# Patient Record
Sex: Female | Born: 1985 | Race: White | Hispanic: No | Marital: Single | Smoking: Former smoker
Health system: Southern US, Community
[De-identification: ages and names within clinical notes are randomized; demographics above are authoritative.]

## PROBLEM LIST (undated history)

## (undated) DIAGNOSIS — D689 Coagulation defect, unspecified: Secondary | ICD-10-CM

## (undated) DIAGNOSIS — R110 Nausea: Secondary | ICD-10-CM

## (undated) DIAGNOSIS — L940 Localized scleroderma [morphea]: Secondary | ICD-10-CM

## (undated) DIAGNOSIS — R011 Cardiac murmur, unspecified: Secondary | ICD-10-CM

## (undated) DIAGNOSIS — G47419 Narcolepsy without cataplexy: Secondary | ICD-10-CM

## (undated) DIAGNOSIS — R251 Tremor, unspecified: Secondary | ICD-10-CM

## (undated) DIAGNOSIS — R5382 Chronic fatigue, unspecified: Secondary | ICD-10-CM

## (undated) DIAGNOSIS — K529 Noninfective gastroenteritis and colitis, unspecified: Secondary | ICD-10-CM

## (undated) DIAGNOSIS — J45909 Unspecified asthma, uncomplicated: Secondary | ICD-10-CM

## (undated) DIAGNOSIS — G4730 Sleep apnea, unspecified: Secondary | ICD-10-CM

## (undated) DIAGNOSIS — B009 Herpesviral infection, unspecified: Secondary | ICD-10-CM

## (undated) DIAGNOSIS — J309 Allergic rhinitis, unspecified: Secondary | ICD-10-CM

## (undated) DIAGNOSIS — D649 Anemia, unspecified: Secondary | ICD-10-CM

## (undated) DIAGNOSIS — G4733 Obstructive sleep apnea (adult) (pediatric): Secondary | ICD-10-CM

## (undated) DIAGNOSIS — F32A Depression, unspecified: Secondary | ICD-10-CM

## (undated) DIAGNOSIS — E039 Hypothyroidism, unspecified: Secondary | ICD-10-CM

## (undated) DIAGNOSIS — M1990 Unspecified osteoarthritis, unspecified site: Secondary | ICD-10-CM

## (undated) DIAGNOSIS — T7840XA Allergy, unspecified, initial encounter: Secondary | ICD-10-CM

## (undated) DIAGNOSIS — N3090 Cystitis, unspecified without hematuria: Secondary | ICD-10-CM

## (undated) DIAGNOSIS — G43909 Migraine, unspecified, not intractable, without status migrainosus: Secondary | ICD-10-CM

## (undated) DIAGNOSIS — J4530 Mild persistent asthma, uncomplicated: Secondary | ICD-10-CM

## (undated) DIAGNOSIS — I7300 Raynaud's syndrome without gangrene: Secondary | ICD-10-CM

## (undated) DIAGNOSIS — K589 Irritable bowel syndrome without diarrhea: Secondary | ICD-10-CM

## (undated) DIAGNOSIS — F329 Major depressive disorder, single episode, unspecified: Secondary | ICD-10-CM

## (undated) DIAGNOSIS — K219 Gastro-esophageal reflux disease without esophagitis: Secondary | ICD-10-CM

## (undated) DIAGNOSIS — M797 Fibromyalgia: Secondary | ICD-10-CM

## (undated) DIAGNOSIS — F419 Anxiety disorder, unspecified: Secondary | ICD-10-CM

## (undated) DIAGNOSIS — I10 Essential (primary) hypertension: Secondary | ICD-10-CM

## (undated) DIAGNOSIS — R42 Dizziness and giddiness: Secondary | ICD-10-CM

## (undated) DIAGNOSIS — J029 Acute pharyngitis, unspecified: Secondary | ICD-10-CM

## (undated) DIAGNOSIS — Z8619 Personal history of other infectious and parasitic diseases: Secondary | ICD-10-CM

## (undated) DIAGNOSIS — M65319 Trigger thumb, unspecified thumb: Secondary | ICD-10-CM

## (undated) DIAGNOSIS — R000 Tachycardia, unspecified: Secondary | ICD-10-CM

## (undated) DIAGNOSIS — R0600 Dyspnea, unspecified: Secondary | ICD-10-CM

## (undated) DIAGNOSIS — A770 Spotted fever due to Rickettsia rickettsii: Secondary | ICD-10-CM

## (undated) DIAGNOSIS — B977 Papillomavirus as the cause of diseases classified elsewhere: Secondary | ICD-10-CM

## (undated) DIAGNOSIS — J449 Chronic obstructive pulmonary disease, unspecified: Secondary | ICD-10-CM

## (undated) DIAGNOSIS — R609 Edema, unspecified: Secondary | ICD-10-CM

## (undated) DIAGNOSIS — R002 Palpitations: Secondary | ICD-10-CM

## (undated) DIAGNOSIS — E0590 Thyrotoxicosis, unspecified without thyrotoxic crisis or storm: Secondary | ICD-10-CM

## (undated) DIAGNOSIS — A681 Tick-borne relapsing fever: Secondary | ICD-10-CM

## (undated) DIAGNOSIS — Z720 Tobacco use: Secondary | ICD-10-CM

## (undated) HISTORY — DX: Chronic obstructive pulmonary disease, unspecified: J44.9

## (undated) HISTORY — DX: Tachycardia, unspecified: R00.0

## (undated) HISTORY — DX: Cystitis, unspecified without hematuria: N30.90

## (undated) HISTORY — DX: Palpitations: R00.2

## (undated) HISTORY — DX: Dyspnea, unspecified: R06.00

## (undated) HISTORY — DX: Anemia, unspecified: D64.9

## (undated) HISTORY — DX: Cardiac murmur, unspecified: R01.1

## (undated) HISTORY — DX: Chronic fatigue, unspecified: R53.82

## (undated) HISTORY — DX: Unspecified osteoarthritis, unspecified site: M19.90

## (undated) HISTORY — DX: Trigger thumb, unspecified thumb: M65.319

## (undated) HISTORY — DX: Tick-borne relapsing fever: A68.1

## (undated) HISTORY — DX: Acute pharyngitis, unspecified: J02.9

## (undated) HISTORY — DX: Mild persistent asthma, uncomplicated: J45.30

## (undated) HISTORY — DX: Migraine, unspecified, not intractable, without status migrainosus: G43.909

## (undated) HISTORY — DX: Sleep apnea, unspecified: G47.30

## (undated) HISTORY — DX: Coagulation defect, unspecified: D68.9

## (undated) HISTORY — DX: Tremor, unspecified: R25.1

## (undated) HISTORY — DX: Irritable bowel syndrome, unspecified: K58.9

## (undated) HISTORY — DX: Gastro-esophageal reflux disease without esophagitis: K21.9

## (undated) HISTORY — DX: Raynaud's syndrome without gangrene: I73.00

## (undated) HISTORY — DX: Edema, unspecified: R60.9

## (undated) HISTORY — DX: Fibromyalgia: M79.7

## (undated) HISTORY — DX: Nausea: R11.0

## (undated) HISTORY — DX: Personal history of other infectious and parasitic diseases: Z86.19

## (undated) HISTORY — PX: APPENDECTOMY: SHX54

## (undated) HISTORY — DX: Allergy, unspecified, initial encounter: T78.40XA

## (undated) HISTORY — DX: Noninfective gastroenteritis and colitis, unspecified: K52.9

## (undated) HISTORY — DX: Allergic rhinitis, unspecified: J30.9

---
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
## 2009-01-21 ENCOUNTER — Inpatient Hospital Stay (HOSPITAL_COMMUNITY): Admission: AD | Admit: 2009-01-21 | Discharge: 2009-01-21 | Payer: Self-pay | Admitting: Obstetrics & Gynecology

## 2009-02-25 ENCOUNTER — Ambulatory Visit: Admission: RE | Admit: 2009-02-25 | Discharge: 2009-02-25 | Payer: Self-pay | Admitting: Obstetrics and Gynecology

## 2009-02-26 ENCOUNTER — Ambulatory Visit (HOSPITAL_COMMUNITY): Admission: AD | Admit: 2009-02-26 | Discharge: 2009-02-26 | Payer: Self-pay | Admitting: Obstetrics & Gynecology

## 2009-04-16 ENCOUNTER — Inpatient Hospital Stay (HOSPITAL_COMMUNITY): Admission: AD | Admit: 2009-04-16 | Discharge: 2009-04-16 | Payer: Self-pay | Admitting: Obstetrics and Gynecology

## 2009-04-19 ENCOUNTER — Inpatient Hospital Stay (HOSPITAL_COMMUNITY): Admission: AD | Admit: 2009-04-19 | Discharge: 2009-04-21 | Payer: Self-pay | Admitting: Obstetrics and Gynecology

## 2009-04-29 ENCOUNTER — Inpatient Hospital Stay (HOSPITAL_COMMUNITY): Admission: AD | Admit: 2009-04-29 | Discharge: 2009-04-29 | Payer: Self-pay | Admitting: Obstetrics and Gynecology

## 2009-11-01 ENCOUNTER — Inpatient Hospital Stay (HOSPITAL_COMMUNITY): Admission: AD | Admit: 2009-11-01 | Discharge: 2009-11-01 | Payer: Self-pay | Admitting: Obstetrics and Gynecology

## 2010-10-05 LAB — POCT PREGNANCY, URINE: Preg Test, Ur: NEGATIVE

## 2010-10-05 LAB — CBC
HCT: 42.4 % (ref 36.0–46.0)
Hemoglobin: 14.3 g/dL (ref 12.0–15.0)
RBC: 4.43 MIL/uL (ref 3.87–5.11)
RDW: 14.7 % (ref 11.5–15.5)
WBC: 10.5 10*3/uL (ref 4.0–10.5)

## 2010-10-21 LAB — CBC
HCT: 34.1 % — ABNORMAL LOW (ref 36.0–46.0)
Hemoglobin: 11.6 g/dL — ABNORMAL LOW (ref 12.0–15.0)
MCHC: 34.2 g/dL (ref 30.0–36.0)
MCHC: 34.8 g/dL (ref 30.0–36.0)
MCV: 96.8 fL (ref 78.0–100.0)
MCV: 97.8 fL (ref 78.0–100.0)
Platelets: 263 10*3/uL (ref 150–400)
RDW: 12.7 % (ref 11.5–15.5)
RDW: 13.1 % (ref 11.5–15.5)

## 2010-10-24 LAB — URINALYSIS, ROUTINE W REFLEX MICROSCOPIC
Bilirubin Urine: NEGATIVE
Ketones, ur: NEGATIVE mg/dL
Nitrite: NEGATIVE
Protein, ur: NEGATIVE mg/dL

## 2010-10-24 LAB — DIFFERENTIAL
Basophils Relative: 0 % (ref 0–1)
Lymphs Abs: 2.8 10*3/uL (ref 0.7–4.0)
Monocytes Absolute: 1 10*3/uL (ref 0.1–1.0)
Monocytes Relative: 6 % (ref 3–12)
Neutro Abs: 13.2 10*3/uL — ABNORMAL HIGH (ref 1.7–7.7)
Neutrophils Relative %: 77 % (ref 43–77)

## 2010-10-24 LAB — COMPREHENSIVE METABOLIC PANEL
Albumin: 3 g/dL — ABNORMAL LOW (ref 3.5–5.2)
Alkaline Phosphatase: 71 U/L (ref 39–117)
BUN: 5 mg/dL — ABNORMAL LOW (ref 6–23)
Calcium: 8.7 mg/dL (ref 8.4–10.5)
Potassium: 3.5 mEq/L (ref 3.5–5.1)
Sodium: 133 mEq/L — ABNORMAL LOW (ref 135–145)
Total Protein: 6.2 g/dL (ref 6.0–8.3)

## 2010-10-24 LAB — CBC
HCT: 29.5 % — ABNORMAL LOW (ref 36.0–46.0)
MCHC: 35.7 g/dL (ref 30.0–36.0)
Platelets: 288 10*3/uL (ref 150–400)
RDW: 11.9 % (ref 11.5–15.5)

## 2010-12-03 NOTE — Discharge Summary (Signed)
NAME:  Amber [name], ANDON NO.:  [id number]   MEDICAL RECORD NO.:  [id number]          PATIENT TYPE:  IPS   LOCATION:  0502                          FACILITY:  [facility]   PHYSICIAN:  Jeanice Lim, M.D. DATE OF BIRTH:  [birth year]   DATE OF ADMISSION:  08/01/2005  DATE OF DISCHARGE:  08/19/2005                                 DISCHARGE SUMMARY   IDENTIFYING DATA:  This is an 25 year old single Caucasian female  voluntarily admitted.  Presenting with a history of depression and suicidal  ideation following after a break up with boyfriend.  The patient has quite a  bit of difficulty after relationships end with coping with being on her own,  had been crying, breaking down, calling in sick from work, had been binge-  drinking on the weekends.  Used cocaine intermittently in the past and had  lost 13 pounds.  The patient described significant mood swings with racing  thoughts and reactivity, becoming initially anxious and then anger and rage  at times, anger first and then severe panic.   PAST PSYCHIATRIC HISTORY:  [facility] admission.  Had a  history of suicidal ideation with a plan and cutting in the past at age 56.  Currently living with her mother who patient reports is alcohol dependent.  Previously with grandmother.  Asked to move out because she could not follow  the rules.  Works at [employer] and, again, has been calling in sick.  Drinking  escalating amounts on the weekend.  Apparently was not drinking or using  substances when with the boyfriend.   MEDICAL HISTORY:  No medical problems.   MEDICATIONS:  Birth control pill, acid reflux medicine.  Had been on Ambien  and Effexor.   ALLERGIES:  No known drug allergies.   PHYSICAL EXAMINATION:  Physical and neurologic exam essentially within  normal limits.   MENTAL STATUS EXAM:  Fully alert female.  Cooperative.  Good eye contact.  Speech clear.  Mood depressed, teary-eyed.  Thought processes goal  directed.  Cognitively intact.  Judgment and insight were fair.   ADMISSION DIAGNOSES:  AXIS I:  Major depressive disorder, recurrent, severe.  Rule out bipolar disorder, type 2.  Possible alcohol abuse with binge  pattern.  AXIS II:  Deferred.  AXIS III:  Urinary tract infection.  AXIS IV:  Moderate (stressors with unhealthy living situation and limited  support system and lack of mature coping skills to deal with stress and  changes in mood).  AXIS V:  35/60.   HOSPITAL COURSE:  The patient was admitted and ordered routine p.r.n.  medications and underwent further monitoring.  Was encouraged to participate  in individual, group and milieu therapy.  Family session was ordered to get  more collateral history since patient's report was somewhat inconsistent at  times and to identify aftercare planning and identify the safety of her  support system.  The patient described initially clear mood swings, anger,  not sleeping, needing more and more medications, taking Librium and Seroquel  and not sleeping, although she began to be clearly observed to be sleeping  more and more during the day and then, when seen, would be up and  complaining of feeling anxious, angry, irritable and then go back to sleep  after being seen.  Medications have been titrated up to try to stabilize  patient's complaints and then, when patient is possibly med-seeking or  calming down her moods with taking excessive medications is more clear and  family was spoken to regarding this and they had confirmed that she does  apparently do this at home as well.  The patient was tapered down to a  minimal effective doses of medications to improve cognition.  The patient,  at one point, was experiencing some visual and auditory hallucinations and  confusion, which again appeared to be interactions to medications and a  behavioral idea episode which results in her getting even more medication.  When medications were  decreased, the patient gradually cleared.  Still  complained of feeling agitated at times with mood instability but overall  appeared to be able to control mood and as she cognitively became more clear  she was more able to identify triggers for her mood instability and  healthier coping skills.  The patient's mood was much less depressed, much  less desperate with significant decrease in anxiety.  Was able to be taken  off one-to-one when her mental state cleared and patient was advised to work  on healthier behavioral changes, like being out of bed, out of her room but,  during the day, exercising, only sleeping at night, not taking more  medication to help her sleep at night and not taking stimulants to help her  stay awake during the day by caffeine and to stay in a structured routine  and develop a healthier support system.  Also, she identified her living  situation as being unhealthy and she was also advised strongly that she  needs therapy and therapy will likely be more beneficial including CBT and  DBT in the long run than relying on medications.   CONDITION ON DISCHARGE:  The patient was discharged in significantly  improved condition.  Family was pleased with the patient's progress.  There  was an occasional phone call to mother in the evenings and she would be  upset about the patient's worsening state and this was addressed as soon as  message was relayed to psychiatrist.  Psychiatrist contacted mother and  explained medication changes.  Mother was then pleased with patient's  progress as well as mother agreed that she may need to get treatment  regarding her mood and actually seen somebody recently and was also advised  to get sleep since she had not slept in three nights and mother seemed  thankful and agreement of the treatment plan regarding the daughter.  Issues regarding patient's escalating around dose or adjusting her own doses of  medications were discussed and  medications were being monitored.  The  patient was given medication education at the time of discharge and, at that  time, her mood was stable.  Alert and oriented x4.  Clear.  No psychotic  symptoms.  No mood instability.  No anxiety.  No psychotic symptoms.  No  dangerous ideation.   DISCHARGE MEDICATIONS:  1.  Depakote ER 250 mg in the morning and 250 mg, 2 at 9 p.m., totaling 500      mg at 9 p.m.  2.  Vistaril 50 mg every eight hours only as needed for anxiety.  3.  __________ 3 in a 24-hour period.  4.  Trazodone 100 mg at 9 p.m.  5.  Risperdal 1 mg at 9 p.m.  6.  Provigil 100 mg at 7 a.m.  7.  Zoloft 25 mg q.a.m.   FOLLOW UP:  Behavioral changes again were emphasized and the importance of  patient being compliant with her follow-up therapy has been set up with  [facility] and [facility] follow-up for August 24, 2005 at 2:30 p.m. and therapist on August 24, 2005 at 12:45 p.m.  Parent  was to make sure that patient made it to her therapy and medication  management session.  The patient was discharged in significantly improved  condition.      Jeanice Lim, M.D.  Electronically Signed     JEM/MEDQ  D:  09/10/2005  T:  09/11/2005  Job:  [id number]

## 2010-12-03 NOTE — H&P (Signed)
NAME:  Amber [name], WARDA NO.:  [id number]   MEDICAL RECORD NO.:  [id number]          PATIENT TYPE:  IPS   LOCATION:  0502                          [facility]ILITY[facility]   PHYSICIAN:  Jeanice Lim, M.D. DATE OF BIRTH:  July 11, 1986   DATE OF ADMISSION:  08/01/2005  DATE OF DISCHARGE:                         PSYCHIATRIC ADMISSION ASSESSMENT   An 25 year old single white female voluntarily admitted August 01, 2005.   HISTORY OF PRESENT ILLNESS:  Patient presents with a history of depression  and suicidal thoughts with plans to crash her car.  Patient states her  symptoms have been present for about a month after a breakup with a  boyfriend.  She has been crying and breaking down at work.  She reports  difficulty sleeping, feeling like her mind is racing.  She states she has  been calling out to work due to her problems with anxiety.  She reports  recent use of binge drinking and has been using cocaine intermittently.  She  has lot about 15 pounds.  She denies any psychotic symptoms.   PAST PSYCHIATRIC HISTORY:  First admission to [facility].  She  has a history of overdosing and cutting herself at the age of 17.   SOCIAL HISTORY:  This is an 25 year old single white female with no children  who lives with her mother.  Finished high school, works at [employer] and has no  Print[employer].   FAMILY HISTORY:  Mother with alcohol problems.   ALCOHOL AND DRUG HISTORY:  Patient smokes.  She has been bingeing on the  weekend, drinking liquor.   PRIMARY CARE Helga Asbury:  Dr. Bethena Midget in [place].   MEDICAL PROBLEMS:  This is a healthy young female in no acute distress.   MEDICATIONS:  1.  Has been on birth control pills  2.  Ambien 10.  3.  Effexor.  4.  Takes medications for her acid reflux as well.   DRUG ALLERGIES:  No known drug allergies.   PHYSICAL EXAMINATION:  Patient was assessed in the emergency room.  This is  a young, healthy-appearing female.   Temperature 96.8, heart rate 80,  respirations 22, blood pressure 107/68.  Pregnancy test is negative.  Urine  drug screen positive for amphetamines.  Urinalysis shows 10-15 WBCs.  Glucose 55.  CBC within normal limits.   MENTAL STATUS EXAM:  Healthy alert female, cooperative with good eye  contact.  Speech is clear, normal rate and tone.  Patient feels depressed.  Patient gets teary eyed at times throughout the interview.  Thought  processes are coherent and linear.  Cognitive function intact.  Memory is  good.  Judgment and insight are fair.  Poor impulse control.   ADMISSION DIAGNOSIS:  AXIS I:  Major depressive disorder.  Alcohol abuse.  Cocaine abuse.  Rule out bipolar II disorder.  AXIS II:  Deferred.  AXIS III:  Urinary tract infection.  AXIS IV:  Other psychosocial problems.  AXIS V:  Current in 35.   PLAN:  Stabilize mood and thinking.  We will continue with patient  medications at this time.  She will have  some Seroquel for sleep.  Patient  is to increase her coping skills.  Underage drinking and substance abuse  were discussed.  We will have a family session with patient's support group.  Patient may need some individual therapy.  Tentative length of stay 4-6  days.      Landry Corporal, N.P.      Jeanice Lim, M.D.  Electronically Signed    JO/MEDQ  D:  08/05/2005  T:  08/05/2005  Job:  [id number]

## 2011-02-09 ENCOUNTER — Encounter (HOSPITAL_COMMUNITY): Payer: Self-pay | Admitting: *Deleted

## 2011-02-09 ENCOUNTER — Inpatient Hospital Stay (HOSPITAL_COMMUNITY)
Admission: AD | Admit: 2011-02-09 | Discharge: 2011-02-09 | Disposition: A | Discharge status: Home / Self Care | Payer: Medicaid Other | Source: Ambulatory Visit | Attending: Obstetrics & Gynecology | Admitting: Obstetrics & Gynecology

## 2011-02-09 DIAGNOSIS — Z3202 Encounter for pregnancy test, result negative: Secondary | ICD-10-CM | POA: Insufficient documentation

## 2011-02-09 DIAGNOSIS — F17200 Nicotine dependence, unspecified, uncomplicated: Secondary | ICD-10-CM | POA: Insufficient documentation

## 2011-02-09 HISTORY — DX: Major depressive disorder, single episode, unspecified: F32.9

## 2011-02-09 HISTORY — DX: Depression, unspecified: F32.A

## 2011-02-09 HISTORY — DX: Thyrotoxicosis, unspecified without thyrotoxic crisis or storm: E05.90

## 2011-02-09 HISTORY — DX: Anxiety disorder, unspecified: F41.9

## 2011-02-09 LAB — HCG, SERUM, QUALITATIVE: Preg, Serum: NEGATIVE

## 2011-02-09 NOTE — Progress Notes (Signed)
Pt states she is having oral surgery on 7-30 and wants to have a pregnancy test. Wants to have a blood pregnancy test. Her last period was lighter than usual. Has had 2 NEG HPT's. Is currently taking BCP's.

## 2011-02-09 NOTE — ED Provider Notes (Addendum)
History    patient is a 25 year old white female. She is a gravida 1 para 1. She states she is here for pregnancy test. She is scheduled for a oral surgery. She states these make sure she is not pregnant before that time. She states she thinks she is pregnant because she's been having pregnancy symptoms. She has denied any vaginal bleeding, vaginal discharge or severe abdominal pain.  Chief Complaint  Patient presents with  . Possible Pregnancy   HPI  OB History    Grav Para Term Preterm Abortions TAB SAB Ect Mult Living   1 1        1       Past Medical History  Diagnosis Date  . Hyperthyroidism   . Depression   . Anxiety     Past Surgical History  Procedure Date  . No past surgeries     No family history on file.  History  Substance Use Topics  . Smoking status: Current Everyday Smoker -- 1.0 packs/day  . Smokeless tobacco: Not on file  . Alcohol Use: No    Allergies: Allergies not on file  No prescriptions prior to admission    Review of Systems  Constitutional: Negative for fever and chills.  Eyes: Negative for blurred vision.  Cardiovascular: Negative for chest pain and palpitations.  Gastrointestinal: Negative for nausea, vomiting, abdominal pain, diarrhea and constipation.  Genitourinary: Negative for dysuria, urgency, frequency, hematuria and flank pain.  Neurological: Negative for dizziness and headaches.  Psychiatric/Behavioral: Negative for depression and suicidal ideas.   Physical Exam   Blood pressure 128/84, pulse 80, temperature 98.8 F (37.1 C), temperature source Oral, resp. rate 16, height 5\' 1"  (1.549 m), weight 112 lb 12.8 oz (51.166 kg), SpO2 98.00%.  Physical Exam  Constitutional: She is oriented to person, place, and time. She appears well-developed and well-nourished. No distress.  HENT:  Head: Normocephalic and atraumatic.  Eyes: EOM are normal. Pupils are equal, round, and reactive to light.  GI: Soft. She exhibits no distension and  no mass. There is no tenderness. There is no rebound and no guarding.  Neurological: She is alert and oriented to person, place, and time.  Skin: Skin is warm and dry. She is not diaphoretic.  Psychiatric: She has a normal mood and affect. Her behavior is normal. Judgment and thought content normal.    MAU Course  Procedures  Results for orders placed during the hospital encounter of 02/09/11 (from the past 24 hour(s))  POCT PREGNANCY, URINE     Status: Normal   Collection Time   02/09/11  5:25 PM      Component Value Range   Preg Test, Ur NEGATIVE    HCG, SERUM, QUALITATIVE     Status: Normal   Collection Time   02/09/11  6:57 PM      Component Value Range   Preg, Serum NEGATIVE  NEGATIVE      Assessment and Plan  Normal examination: Patient will followup with her primary care provider. She has any further problems she will contact us or present immediately. She understood this agreed.  Clinton Gallant. Rice III, DrHSc, MPAS, PA-C  02/09/2011, 6:39 PM

## 2011-02-09 NOTE — ED Notes (Signed)
Pt. To lobby to await lab results.

## 2011-02-09 NOTE — ED Notes (Signed)
Jenean Lindau, PA discussed current status and plan of care.

## 2011-07-19 HISTORY — PX: DILATION AND CURETTAGE OF UTERUS: SHX78

## 2012-01-17 ENCOUNTER — Encounter (HOSPITAL_COMMUNITY): Payer: Self-pay | Admitting: *Deleted

## 2012-01-17 ENCOUNTER — Emergency Department (HOSPITAL_COMMUNITY): Payer: Medicaid Other

## 2012-01-17 ENCOUNTER — Emergency Department (HOSPITAL_COMMUNITY)
Admission: EM | Admit: 2012-01-17 | Discharge: 2012-01-17 | Disposition: A | Discharge status: Home / Self Care | Payer: Medicaid Other | Attending: Emergency Medicine | Admitting: Emergency Medicine

## 2012-01-17 DIAGNOSIS — Z3490 Encounter for supervision of normal pregnancy, unspecified, unspecified trimester: Secondary | ICD-10-CM

## 2012-01-17 DIAGNOSIS — M549 Dorsalgia, unspecified: Secondary | ICD-10-CM | POA: Insufficient documentation

## 2012-01-17 DIAGNOSIS — N898 Other specified noninflammatory disorders of vagina: Secondary | ICD-10-CM | POA: Insufficient documentation

## 2012-01-17 DIAGNOSIS — F17200 Nicotine dependence, unspecified, uncomplicated: Secondary | ICD-10-CM | POA: Insufficient documentation

## 2012-01-17 DIAGNOSIS — F411 Generalized anxiety disorder: Secondary | ICD-10-CM | POA: Insufficient documentation

## 2012-01-17 DIAGNOSIS — O99891 Other specified diseases and conditions complicating pregnancy: Secondary | ICD-10-CM | POA: Insufficient documentation

## 2012-01-17 DIAGNOSIS — F329 Major depressive disorder, single episode, unspecified: Secondary | ICD-10-CM | POA: Insufficient documentation

## 2012-01-17 DIAGNOSIS — F3289 Other specified depressive episodes: Secondary | ICD-10-CM | POA: Insufficient documentation

## 2012-01-17 LAB — CBC WITH DIFFERENTIAL/PLATELET
Basophils Absolute: 0 10*3/uL (ref 0.0–0.1)
Eosinophils Relative: 2 % (ref 0–5)
HCT: 38.9 % (ref 36.0–46.0)
Hemoglobin: 14 g/dL (ref 12.0–15.0)
Lymphocytes Relative: 29 % (ref 12–46)
Lymphs Abs: 3.4 10*3/uL (ref 0.7–4.0)
MCV: 94.6 fL (ref 78.0–100.0)
Monocytes Absolute: 1.4 10*3/uL — ABNORMAL HIGH (ref 0.1–1.0)
Monocytes Relative: 12 % (ref 3–12)
Neutro Abs: 6.8 10*3/uL (ref 1.7–7.7)
RBC: 4.11 MIL/uL (ref 3.87–5.11)
RDW: 12.2 % (ref 11.5–15.5)
WBC: 11.8 10*3/uL — ABNORMAL HIGH (ref 4.0–10.5)

## 2012-01-17 LAB — BASIC METABOLIC PANEL
BUN: 9 mg/dL (ref 6–23)
CO2: 26 mEq/L (ref 19–32)
Calcium: 10.4 mg/dL (ref 8.4–10.5)
Chloride: 99 mEq/L (ref 96–112)
Creatinine, Ser: 0.69 mg/dL (ref 0.50–1.10)
Glucose, Bld: 92 mg/dL (ref 70–99)

## 2012-01-17 LAB — WET PREP, GENITAL
Trich, Wet Prep: NONE SEEN
WBC, Wet Prep HPF POC: NONE SEEN

## 2012-01-17 LAB — URINALYSIS, ROUTINE W REFLEX MICROSCOPIC
Glucose, UA: NEGATIVE mg/dL
Hgb urine dipstick: NEGATIVE
Ketones, ur: NEGATIVE mg/dL
Leukocytes, UA: NEGATIVE
Protein, ur: NEGATIVE mg/dL
pH: 7 (ref 5.0–8.0)

## 2012-01-17 LAB — HCG, QUANTITATIVE, PREGNANCY: hCG, Beta Chain, Quant, S: 29976 m[IU]/mL — ABNORMAL HIGH (ref <5)

## 2012-01-17 NOTE — ED Provider Notes (Signed)
History     CSN: [id number]  Arrival date & time 01/17/12  1001   First MD Initiated Contact with Patient 01/17/12 1008      Chief Complaint  Patient presents with  . Back Pain  . Vaginal Discharge    (Consider location/radiation/quality/duration/timing/severity/associated sxs/prior treatment) HPI Pt is a G2P1, recently had home pregnancy test, [facility]. States she is approx 8wks by LMP and has had some mild lower abdominal cramping off and on since pos hcg. She has had some white vaginal discharge, but no bleeding. This morning she also began having moderate aching lower back pain radiating to mid back. No dysuria, hematuria, vomiting or fever. She has not had any further prenatal care yet. No prior miscarriages or ectopics but has one prior treatment for GC.   Past Medical History  Diagnosis Date  . Hyperthyroidism   . Depression   . Anxiety     Past Surgical History  Procedure Date  . No past surgeries     No family history on file.  History  Substance Use Topics  . Smoking status: Current Everyday Smoker -- 1.0 packs/day  . Smokeless tobacco: Not on file  . Alcohol Use: No    OB History    Grav Para Term Preterm Abortions TAB SAB Ect Mult Living   1 1        1       Review of Systems All other systems reviewed and are negative except as noted in HPI.   Allergies  Review of patient's allergies indicates no known allergies.  Home Medications   Current Outpatient Rx  Name Route Sig Dispense Refill  . ACETAMINOPHEN 500 MG PO TABS Oral Take 500 mg by mouth every 6 (six) hours as needed. For pain    . LEVOTHYROXINE SODIUM 25 MCG PO TABS Oral Take 25 mcg by mouth daily.    Marland Kitchen PRENATAL MULTIVITAMIN CH Oral Take 1 tablet by mouth daily.      BP 104/66  Pulse 63  Temp 99.2 F (37.3 C) (Oral)  Resp 18  SpO2 100%  Physical Exam  Nursing note and vitals reviewed. Constitutional: She is oriented to person, place, and time. She appears  well-developed and well-nourished.  HENT:  Head: Normocephalic and atraumatic.  Eyes: EOM are normal. Pupils are equal, round, and reactive to light.  Neck: Normal range of motion. Neck supple.  Cardiovascular: Normal rate, normal heart sounds and intact distal pulses.   Pulmonary/Chest: Effort normal and breath sounds normal.  Abdominal: Bowel sounds are normal. She exhibits no distension. There is no tenderness.  Genitourinary: Uterus is not tender. Cervix exhibits no motion tenderness. Right adnexum displays no mass and no tenderness. Left adnexum displays no mass and no tenderness. No tenderness or bleeding around the vagina. Vaginal discharge (thin white discharge) found.  Musculoskeletal: Normal range of motion. She exhibits no edema and no tenderness.       Lumbar back: She exhibits tenderness. She exhibits no bony tenderness.  Neurological: She is alert and oriented to person, place, and time. She has normal strength. No cranial nerve deficit or sensory deficit.  Skin: Skin is warm and dry. No rash noted.  Psychiatric: She has a normal mood and affect.    ED Course  Procedures (including critical care time)  Labs Reviewed  CBC WITH DIFFERENTIAL - Abnormal; Notable for the following:    WBC 11.8 (*)     MCH 34.1 (*)     Monocytes  Absolute 1.4 (*)     All other components within normal limits  BASIC METABOLIC PANEL  URINALYSIS, ROUTINE W REFLEX MICROSCOPIC  HCG, QUANTITATIVE, PREGNANCY  WET PREP, GENITAL  GC/CHLAMYDIA PROBE AMP, GENITAL   No results found.   No diagnosis found.    MDM  Will check [place] to rule out ectopic. Labs sent.    12:19 PM US shows 6 week IUP, no infection on UA or wet prep. Advised Ob/Gyn followup     Dyneisha Murchison B. Bernette Mayers, MD 01/17/12 1219

## 2012-01-17 NOTE — ED Notes (Signed)
Pt d/c home in NAD. Pt voiced understanding of d/c instructions and follow up care. Pt d/c home in NAD.

## 2012-01-17 NOTE — ED Notes (Signed)
Pt returned from US

## 2012-01-17 NOTE — ED Notes (Signed)
Patient transported to Ultrasound 

## 2012-01-17 NOTE — ED Notes (Signed)
Pt states woke up this am with lower back pain up to mid back. States positive pregnancy test- approx [redacted] weeks pregnant. Denies vaginal bleeding, but has had white vaginal discharge. Reports abdominal cramping. Pt's second pregnancy.

## 2012-01-18 LAB — GC/CHLAMYDIA PROBE AMP, GENITAL: Chlamydia, DNA Probe: NEGATIVE

## 2012-02-01 ENCOUNTER — Other Ambulatory Visit: Payer: Self-pay | Admitting: Obstetrics and Gynecology

## 2012-02-02 ENCOUNTER — Encounter (HOSPITAL_COMMUNITY): Payer: Self-pay | Admitting: Pharmacist

## 2012-02-02 ENCOUNTER — Encounter (HOSPITAL_COMMUNITY): Payer: Self-pay | Admitting: *Deleted

## 2012-02-02 ENCOUNTER — Encounter (HOSPITAL_COMMUNITY)
Admission: RE | Disposition: A | Discharge status: Home / Self Care | Payer: Self-pay | Source: Ambulatory Visit | Attending: Obstetrics and Gynecology

## 2012-02-02 ENCOUNTER — Encounter (HOSPITAL_COMMUNITY): Payer: Self-pay | Admitting: Anesthesiology

## 2012-02-02 ENCOUNTER — Ambulatory Visit (HOSPITAL_COMMUNITY)
Admission: RE | Admit: 2012-02-02 | Discharge: 2012-02-02 | Disposition: A | Discharge status: Home / Self Care | Payer: Medicaid Other | Source: Ambulatory Visit | Attending: Obstetrics and Gynecology | Admitting: Obstetrics and Gynecology

## 2012-02-02 ENCOUNTER — Ambulatory Visit (HOSPITAL_COMMUNITY): Payer: Medicaid Other | Admitting: Anesthesiology

## 2012-02-02 DIAGNOSIS — O034 Incomplete spontaneous abortion without complication: Secondary | ICD-10-CM | POA: Insufficient documentation

## 2012-02-02 DIAGNOSIS — O039 Complete or unspecified spontaneous abortion without complication: Secondary | ICD-10-CM

## 2012-02-02 HISTORY — PX: DILATION AND EVACUATION: SHX1459

## 2012-02-02 LAB — CBC
HCT: 40.9 % (ref 36.0–46.0)
Hemoglobin: 13.8 g/dL (ref 12.0–15.0)
RBC: 4.2 MIL/uL (ref 3.87–5.11)
WBC: 14.1 10*3/uL — ABNORMAL HIGH (ref 4.0–10.5)

## 2012-02-02 SURGERY — DILATION AND EVACUATION, UTERUS
Anesthesia: Monitor Anesthesia Care | Site: Vagina | Wound class: Clean Contaminated

## 2012-02-02 MED ORDER — CEFAZOLIN SODIUM-DEXTROSE 2-3 GM-% IV SOLR
INTRAVENOUS | Status: AC
  Filled 2012-02-02: qty 50

## 2012-02-02 MED ORDER — LACTATED RINGERS IV SOLN
INTRAVENOUS | Status: DC
  Administered 2012-02-02: 11:00:00 via INTRAVENOUS

## 2012-02-02 MED ORDER — LIDOCAINE HCL (CARDIAC) 20 MG/ML IV SOLN
INTRAVENOUS | Status: AC
  Filled 2012-02-02: qty 5

## 2012-02-02 MED ORDER — MIDAZOLAM HCL 5 MG/5ML IJ SOLN
INTRAMUSCULAR | Status: DC | PRN
  Administered 2012-02-02: 2 mg via INTRAVENOUS

## 2012-02-02 MED ORDER — GLYCOPYRROLATE 0.2 MG/ML IJ SOLN
INTRAMUSCULAR | Status: AC
  Filled 2012-02-02: qty 1

## 2012-02-02 MED ORDER — LIDOCAINE HCL 1 % IJ SOLN
INTRAMUSCULAR | Status: DC | PRN
  Administered 2012-02-02: 10 mL

## 2012-02-02 MED ORDER — PROPOFOL 10 MG/ML IV EMUL
INTRAVENOUS | Status: DC | PRN
  Administered 2012-02-02 (×3): 50 mg via INTRAVENOUS

## 2012-02-02 MED ORDER — KETOROLAC TROMETHAMINE 30 MG/ML IJ SOLN
INTRAMUSCULAR | Status: DC | PRN
  Administered 2012-02-02: 30 mg via INTRAVENOUS

## 2012-02-02 MED ORDER — PROPOFOL 10 MG/ML IV EMUL
INTRAVENOUS | Status: AC
  Filled 2012-02-02: qty 20

## 2012-02-02 MED ORDER — LIDOCAINE HCL (CARDIAC) 20 MG/ML IV SOLN
INTRAVENOUS | Status: DC | PRN
  Administered 2012-02-02: 50 mg via INTRAVENOUS

## 2012-02-02 MED ORDER — GLYCOPYRROLATE 0.2 MG/ML IJ SOLN
INTRAMUSCULAR | Status: DC | PRN
  Administered 2012-02-02: 0.2 mg via INTRAVENOUS

## 2012-02-02 MED ORDER — FENTANYL CITRATE 0.05 MG/ML IJ SOLN
INTRAMUSCULAR | Status: DC | PRN
  Administered 2012-02-02: 50 ug via INTRAVENOUS

## 2012-02-02 MED ORDER — FENTANYL CITRATE 0.05 MG/ML IJ SOLN
INTRAMUSCULAR | Status: AC
  Filled 2012-02-02: qty 2

## 2012-02-02 MED ORDER — MIDAZOLAM HCL 2 MG/2ML IJ SOLN
INTRAMUSCULAR | Status: AC
  Filled 2012-02-02: qty 2

## 2012-02-02 MED ORDER — KETOROLAC TROMETHAMINE 30 MG/ML IJ SOLN
INTRAMUSCULAR | Status: AC
  Filled 2012-02-02: qty 1

## 2012-02-02 MED ORDER — CEFAZOLIN SODIUM-DEXTROSE 2-3 GM-% IV SOLR
2.0000 g | INTRAVENOUS | Status: AC
  Administered 2012-02-02: 2 g via INTRAVENOUS

## 2012-02-02 MED ORDER — ONDANSETRON HCL 4 MG/2ML IJ SOLN
INTRAMUSCULAR | Status: AC
  Filled 2012-02-02: qty 2

## 2012-02-02 SURGICAL SUPPLY — 19 items
CATH ROBINSON RED A/P 16FR (CATHETERS) ×2 IMPLANT
CLOTH BEACON ORANGE TIMEOUT ST (SAFETY) ×2 IMPLANT
DECANTER SPIKE VIAL GLASS SM (MISCELLANEOUS) ×2 IMPLANT
GLOVE ECLIPSE 7.0 STRL STRAW (GLOVE) ×4 IMPLANT
GOWN PREVENTION PLUS LG XLONG (DISPOSABLE) ×2 IMPLANT
GOWN PREVENTION PLUS XLARGE (GOWN DISPOSABLE) ×2 IMPLANT
KIT BERKELEY 1ST TRIMESTER 3/8 (MISCELLANEOUS) ×2 IMPLANT
NDL SPNL 22GX3.5 QUINCKE BK (NEEDLE) ×1 IMPLANT
NEEDLE SPNL 22GX3.5 QUINCKE BK (NEEDLE) ×2 IMPLANT
NS IRRIG 1000ML POUR BTL (IV SOLUTION) ×2 IMPLANT
PACK VAGINAL MINOR WOMEN LF (CUSTOM PROCEDURE TRAY) ×2 IMPLANT
PAD PREP 24X48 CUFFED NSTRL (MISCELLANEOUS) ×2 IMPLANT
SET BERKELEY SUCTION TUBING (SUCTIONS) ×2 IMPLANT
SYR CONTROL 10ML LL (SYRINGE) ×2 IMPLANT
TOWEL OR 17X24 6PK STRL BLUE (TOWEL DISPOSABLE) ×4 IMPLANT
VACURETTE 10 RIGID CVD (CANNULA) IMPLANT
VACURETTE 7MM CVD STRL WRAP (CANNULA) IMPLANT
VACURETTE 8 RIGID CVD (CANNULA) ×1 IMPLANT
VACURETTE 9 RIGID CVD (CANNULA) IMPLANT

## 2012-02-02 NOTE — Anesthesia Postprocedure Evaluation (Signed)
  Anesthesia Post-op Note  Patient: Amber [name]  Procedure(s) Performed: Procedure(s) (LRB): DILATATION AND EVACUATION (N/A) Patient is awake and responsive. Pain and nausea are reasonably well controlled. Vital signs are stable and clinically acceptable. Oxygen saturation is clinically acceptable. There are no apparent anesthetic complications at this time. Patient is ready for discharge.

## 2012-02-02 NOTE — Op Note (Signed)
NAME:  CHRISA, HASSAN NO.:  [id number]  MEDICAL RECORD NO.:  [id number]  LOCATION:  [facility]                          FACILITY:  [facility]  PHYSICIAN:  Malva Limes, M.D.    DATE OF BIRTH:  [birth year]  DATE OF PROCEDURE:  02/02/2012 DATE OF DISCHARGE:                              OPERATIVE REPORT   PREOPERATIVE DIAGNOSIS:  Spontaneous abortion.  POSTOPERATIVE DIAGNOSIS:  Spontaneous abortion.  PROCEDURE:  Dilation and evacuation.  SURGEON:  Malva Limes, MD.  ANESTHESIA:  MAC with paracervical block.  ANTIBIOTICS:  Ancef 2 g.  DRAINS:  None.  SPECIMENS:  Products of conception sent to Pathology.  ESTIMATED BLOOD LOSS:  25 mL.  PROCEDURE:  The patient was taken to the operating room, where she was placed in dorsal supine position.  MAC anesthesia was then administered. She was placed in the dorsal lithotomy position.  She was prepped and draped in the usual fashion for this procedure.  A sterile speculum was placed in her vagina.  Single-tooth tenaculum was applied to the anterior cervical lip.  20 mL of 1% lidocaine was used for paracervical block.  The cervix was serially dilated to a 29-French.  An 8-mm suction cannula was placed into the uterine cavity.  Products of conception withdrawn.  Sharp curettage was performed followed by repeat suction. The patient tolerated the procedure well.  She was taken to the recovery room in stable condition.  Her blood type is B positive and therefore, no RhoGAM is indicated.  She will be discharged to home with Vicodin to take p.r.n.  She will follow up in the office in 4 weeks.  She was instructed not to have sex for 1 week and to call with any heavy bleeding or fever.          ______________________________ Malva Limes, M.D.     MA/MEDQ  D:  02/02/2012  T:  02/02/2012  Job:  [id number]

## 2012-02-02 NOTE — Transfer of Care (Signed)
Immediate Anesthesia Transfer of Care Note  Patient: Amber [name]  Procedure(s) Performed: Procedure(s) (LRB): DILATATION AND EVACUATION (N/A)  Patient Location: PACU  Anesthesia Type: MAC  Level of Consciousness: awake, alert  and oriented  Airway & Oxygen Therapy: Patient Spontanous Breathing  Post-op Assessment: Report given to PACU RN and Post -op Vital signs reviewed and stable  Post vital signs: Reviewed and stable  Complications: No apparent anesthesia complications

## 2012-02-02 NOTE — H&P (Signed)
Pt is a 26 year old white female, G2P1 who presents to the OR for a D&E secondary to a missed ab.  WU:[id number]        HEENT- wnl        ABD- soft, non tender IMP/ SAB PLAN/ D&E

## 2012-02-02 NOTE — Anesthesia Preprocedure Evaluation (Signed)
Anesthesia Evaluation  Patient identified by MRN, date of birth, ID band Patient awake    Reviewed: Allergy & Precautions, H&P , Patient's Chart, lab work & pertinent test results, reviewed documented beta blocker date and time   Airway Mallampati: II TM Distance: >3 FB Neck ROM: full    Dental No notable dental hx.    Pulmonary  breath sounds clear to auscultation  Pulmonary exam normal       Cardiovascular Rhythm:regular Rate:Normal     Neuro/Psych    GI/Hepatic   Endo/Other    Renal/GU      Musculoskeletal   Abdominal   Peds  Hematology   Anesthesia Other Findings   Reproductive/Obstetrics                           Anesthesia Physical Anesthesia Plan  ASA: II  Anesthesia Plan: General and MAC   Post-op Pain Management:    Induction: Intravenous  Airway Management Planned: LMA  Additional Equipment:   Intra-op Plan:   Post-operative Plan:   Informed Consent: I have reviewed the patients History and Physical, chart, labs and discussed the procedure including the risks, benefits and alternatives for the proposed anesthesia with the patient or authorized representative who has indicated his/her understanding and acceptance.   Dental Advisory Given  Plan Discussed with: CRNA and Surgeon  Anesthesia Plan Comments: (  Discussed  MAC and general anesthesia, including possible nausea, instrumentation of airway, sore throat,pulmonary aspiration, etc. I asked if the were any outstanding questions, or  concerns before we proceeded. )        Anesthesia Quick Evaluation

## 2012-02-03 ENCOUNTER — Encounter (HOSPITAL_COMMUNITY): Payer: Self-pay | Admitting: Obstetrics and Gynecology

## 2014-02-27 ENCOUNTER — Emergency Department (HOSPITAL_COMMUNITY): Payer: Medicaid Other

## 2014-02-27 ENCOUNTER — Emergency Department (HOSPITAL_COMMUNITY)
Admission: EM | Admit: 2014-02-27 | Discharge: 2014-02-27 | Disposition: A | Discharge status: Home / Self Care | Payer: Medicaid Other | Attending: Emergency Medicine | Admitting: Emergency Medicine

## 2014-02-27 ENCOUNTER — Encounter (HOSPITAL_COMMUNITY): Payer: Self-pay | Admitting: Emergency Medicine

## 2014-02-27 DIAGNOSIS — F17200 Nicotine dependence, unspecified, uncomplicated: Secondary | ICD-10-CM | POA: Diagnosis not present

## 2014-02-27 DIAGNOSIS — Z8639 Personal history of other endocrine, nutritional and metabolic disease: Secondary | ICD-10-CM | POA: Insufficient documentation

## 2014-02-27 DIAGNOSIS — F3289 Other specified depressive episodes: Secondary | ICD-10-CM | POA: Insufficient documentation

## 2014-02-27 DIAGNOSIS — F329 Major depressive disorder, single episode, unspecified: Secondary | ICD-10-CM | POA: Insufficient documentation

## 2014-02-27 DIAGNOSIS — F411 Generalized anxiety disorder: Secondary | ICD-10-CM | POA: Insufficient documentation

## 2014-02-27 DIAGNOSIS — Z3202 Encounter for pregnancy test, result negative: Secondary | ICD-10-CM | POA: Insufficient documentation

## 2014-02-27 DIAGNOSIS — R1012 Left upper quadrant pain: Secondary | ICD-10-CM | POA: Insufficient documentation

## 2014-02-27 DIAGNOSIS — Z862 Personal history of diseases of the blood and blood-forming organs and certain disorders involving the immune mechanism: Secondary | ICD-10-CM | POA: Insufficient documentation

## 2014-02-27 DIAGNOSIS — R109 Unspecified abdominal pain: Secondary | ICD-10-CM | POA: Insufficient documentation

## 2014-02-27 LAB — COMPREHENSIVE METABOLIC PANEL
ALT: 29 U/L (ref 0–35)
AST: 35 U/L (ref 0–37)
Albumin: 4 g/dL (ref 3.5–5.2)
Alkaline Phosphatase: 65 U/L (ref 39–117)
Anion gap: 12 (ref 5–15)
BUN: 9 mg/dL (ref 6–23)
CO2: 28 mEq/L (ref 19–32)
Calcium: 9.5 mg/dL (ref 8.4–10.5)
Chloride: 99 mEq/L (ref 96–112)
Creatinine, Ser: 0.85 mg/dL (ref 0.50–1.10)
GFR calc Af Amer: >90 mL/min (ref >90)
GFR calc non Af Amer: >90 mL/min (ref >90)
Glucose, Bld: 84 mg/dL (ref 70–99)
Potassium: 3.8 mEq/L (ref 3.7–5.3)
Sodium: 139 mEq/L (ref 137–147)
Total Bilirubin: 0.6 mg/dL (ref 0.3–1.2)
Total Protein: 7.2 g/dL (ref 6.0–8.3)

## 2014-02-27 LAB — URINE MICROSCOPIC-ADD ON

## 2014-02-27 LAB — CBC WITH DIFFERENTIAL/PLATELET
Basophils Absolute: 0 10*3/uL (ref 0.0–0.1)
Basophils Relative: 0 % (ref 0–1)
Eosinophils Absolute: 0.2 10*3/uL (ref 0.0–0.7)
Eosinophils Relative: 3 % (ref 0–5)
HCT: 42.1 % (ref 36.0–46.0)
Hemoglobin: 14.2 g/dL (ref 12.0–15.0)
Lymphocytes Relative: 28 % (ref 12–46)
Lymphs Abs: 2.2 10*3/uL (ref 0.7–4.0)
MCH: 33.2 pg (ref 26.0–34.0)
MCHC: 33.7 g/dL (ref 30.0–36.0)
MCV: 98.4 fL (ref 78.0–100.0)
Monocytes Absolute: 0.7 10*3/uL (ref 0.1–1.0)
Monocytes Relative: 9 % (ref 3–12)
Neutro Abs: 4.9 10*3/uL (ref 1.7–7.7)
Neutrophils Relative %: 60 % (ref 43–77)
Platelets: 216 10*3/uL (ref 150–400)
RBC: 4.28 MIL/uL (ref 3.87–5.11)
RDW: 12.7 % (ref 11.5–15.5)
WBC: 8.1 10*3/uL (ref 4.0–10.5)

## 2014-02-27 LAB — URINALYSIS, ROUTINE W REFLEX MICROSCOPIC
Bilirubin Urine: NEGATIVE
Glucose, UA: NEGATIVE mg/dL
Hgb urine dipstick: NEGATIVE
Ketones, ur: NEGATIVE mg/dL
Leukocytes, UA: NEGATIVE
Nitrite: NEGATIVE
Protein, ur: 30 mg/dL — AB
Specific Gravity, Urine: 1.018 (ref 1.005–1.030)
Urobilinogen, UA: 0.2 mg/dL (ref 0.0–1.0)
pH: 8.5 — ABNORMAL HIGH (ref 5.0–8.0)

## 2014-02-27 LAB — PREGNANCY, URINE: Preg Test, Ur: NEGATIVE

## 2014-02-27 LAB — LIPASE, BLOOD: Lipase: 44 U/L (ref 11–59)

## 2014-02-27 MED ORDER — SUCRALFATE 1 G PO TABS: 1.0000 g | ORAL_TABLET | Freq: Three times a day (TID) | ORAL | Status: DC

## 2014-02-27 MED ORDER — HYDROCODONE-ACETAMINOPHEN 5-325 MG PO TABS: 1.0000 | ORAL_TABLET | Freq: Four times a day (QID) | ORAL | Status: DC | PRN

## 2014-02-27 MED ORDER — IOHEXOL 300 MG/ML  SOLN
80.0000 mL | Freq: Once | INTRAMUSCULAR | Status: AC | PRN
  Administered 2014-02-27: 80 mL via INTRAVENOUS

## 2014-02-27 MED ORDER — FAMOTIDINE 20 MG PO TABS: 20.0000 mg | ORAL_TABLET | Freq: Two times a day (BID) | ORAL | Status: DC

## 2014-02-27 MED ORDER — IOHEXOL 300 MG/ML  SOLN
25.0000 mL | INTRAMUSCULAR | Status: AC
  Administered 2014-02-27 (×2): 25 mL via ORAL

## 2014-02-27 MED ORDER — MORPHINE SULFATE 4 MG/ML IJ SOLN
4.0000 mg | Freq: Once | INTRAMUSCULAR | Status: AC
  Administered 2014-02-27: 4 mg via INTRAVENOUS
  Filled 2014-02-27: qty 1

## 2014-02-27 NOTE — ED Notes (Signed)
Pt reports abd pain and leg pain for 6 months; reports lost a lot of weight; had ultrasound done at [place] and was told it was normal; a/w nausea

## 2014-02-27 NOTE — ED Notes (Signed)
Pt finished contrast. Notified CT 

## 2014-02-27 NOTE — Discharge Instructions (Signed)
Return here as needed. Follow up with the doctor provided. Slowly  Increase your fluid intake.

## 2014-02-27 NOTE — ED Provider Notes (Signed)
CSN: [id number]     Arrival date & time 02/27/14  7829 History   First MD Initiated Contact with Patient 02/27/14 0919     Chief Complaint  Patient presents with  . Abdominal Pain     (Consider location/radiation/quality/duration/timing/severity/associated sxs/prior Treatment) HPI Patient presents to the emergency department with abdominal, pain.  That's been ongoing for 6 months.  The patient, states, that she has had weight loss during that time frame.  She states she had a recent pelvic ultrasound done at [facility].  She states that those results were negative.  The patient, states, that she has not taken any medications for her symptoms.  The patient, states she's not had any chest pain, weakness, dizziness, headache, blurred vision, fever, back pain, dysuria, vaginal bleeding, vaginal discharge, rash or syncope.  The patient, states, that she has not seen anyone in followup since she does not have insurance. Past Medical History  Diagnosis Date  . Hyperthyroidism   . Depression   . Anxiety    Past Surgical History  Procedure Laterality Date  . No past surgeries    . Dilation and evacuation  02/02/2012    Procedure: DILATATION AND EVACUATION;  Surgeon: Levi Aland, MD;  Location: [facility];  Service: Gynecology;  Laterality: N/A;   No family history on file. History  Substance Use Topics  . Smoking status: Current Every Day Smoker -- 1.00 packs/day    Types: Cigarettes  . Smokeless tobacco: Not on file  . Alcohol Use: No   OB History   Grav Para Term Preterm Abortions TAB SAB Ect Mult Living   1 1        1      Review of Systems All other systems negative except as documented in the HPI. All pertinent positives and negatives as reviewed in the HPI.   Allergies  Bactrim  Home Medications   Prior to Admission medications   Medication Sig Start Date End Date Taking? Authorizing Provider  FLUoxetine (PROZAC) 10 MG tablet Take 10 mg by mouth daily.   Yes  Historical Provider, MD  magnesium hydroxide (MILK OF MAGNESIA) 400 MG/5ML suspension Take 2 mLs by mouth daily as needed for mild constipation.   Yes Historical Provider, MD   BP 97/67  Pulse 56  Temp(Src) 97.9 F (36.6 C) (Oral)  Resp 17  Ht 5\' 2"  (1.575 m)  Wt 100 lb (45.36 kg)  BMI 18.29 kg/m2  SpO2 100%  LMP 02/15/2014 Physical Exam  Nursing note and vitals reviewed. Constitutional: She is oriented to person, place, and time. She appears well-developed and well-nourished.  HENT:  Head: Normocephalic and atraumatic.  Mouth/Throat: Oropharynx is clear and moist.  Eyes: Pupils are equal, round, and reactive to light.  Neck: Normal range of motion. Neck supple.  Cardiovascular: Normal rate, regular rhythm and normal heart sounds.  Exam reveals no gallop and no friction rub.   No murmur heard. Pulmonary/Chest: Effort normal and breath sounds normal. No respiratory distress.  Abdominal: Normal appearance and bowel sounds are normal. She exhibits no distension. There is tenderness in the left upper quadrant and left lower quadrant. There is no rigidity, no rebound and no guarding.    Neurological: She is alert and oriented to person, place, and time. She exhibits normal muscle tone. Coordination normal.  Skin: Skin is warm and dry. No rash noted. No erythema.    ED Course  Procedures (including critical care time) Labs Review Labs Reviewed  URINALYSIS, ROUTINE W REFLEX MICROSCOPIC -  Abnormal; Notable for the following:    Color, Urine AMBER (*)    APPearance TURBID (*)    pH 8.5 (*)    Protein, ur 30 (*)    All other components within normal limits  URINE CULTURE  CBC WITH DIFFERENTIAL  COMPREHENSIVE METABOLIC PANEL  LIPASE, BLOOD  PREGNANCY, URINE  URINE MICROSCOPIC-ADD ON    Imaging Review Ct Abdomen Pelvis W Contrast  02/27/2014   CLINICAL DATA:  Left mid and lower abdominal pain.  EXAM: CT ABDOMEN AND PELVIS WITH CONTRAST  TECHNIQUE: Multidetector CT imaging of  the abdomen and pelvis was performed using the standard protocol following bolus administration of intravenous contrast.  CONTRAST:  80 mL OMNIPAQUE IOHEXOL 300 MG/ML  SOLN  COMPARISON:  CT abdomen and pelvis 10/18/2012.  FINDINGS: The lung bases are clear.  No pleural or pericardial effusion.  The gallbladder, liver, spleen, adrenal glands, pancreas, kidneys and biliary tree all appear normal. Uterus, adnexa and urinary bladder are unremarkable. Stomach and small and large bowel appear normal. There is no evidence of appendicitis. No lymphadenopathy or fluid is identified. No bony abnormality.  IMPRESSION: Negative CT abdomen and pelvis.   Electronically Signed   By: Drusilla Kannerhomas  Dalessio M.D.   On: 02/27/2014 12:36    Patient will be referred to GI specialist for further evaluation.  Told to return here as needed.  Told to increase her fluid intake. The patient has nonspecific abdominal pain that has been ongoing for 6 months    Carlyle DollyChristopher W Tomasa Dobransky, PA-C 02/27/14 1322

## 2014-02-28 LAB — URINE CULTURE

## 2014-02-28 NOTE — ED Provider Notes (Signed)
Medical screening examination/treatment/procedure(s) were performed by non-physician practitioner and as supervising physician I was immediately available for consultation/collaboration.   EKG Interpretation None        Audree CamelScott T Marny Smethers, MD 02/28/14 1523

## 2014-02-28 NOTE — Progress Notes (Signed)
  CARE MANAGEMENT ED NOTE 02/28/2014  Patient:  Amber [name],Amber [name]   Account Number:  [id number]  Date Initiated:  02/28/2014  Documentation initiated by:  Ferdinand CavaSCHETTINO,Jolette Lana  Subjective/Objective Assessment:   28 yo female called the ED requesting assist with referral received in ED     Subjective/Objective Assessment Detail:     Action/Plan:   The patient will continue to follow up at the [facility] in [place] but if unable to establish follow up assistance she will then transfer care [facility] for further follow medical assistance.   Action/Plan Detail:   Anticipated DC Date:       Status Recommendation to Physician:   Result of Recommendation:  Agreed    DC Planning Services  CM consult  Other  PCP issues    Choice offered to / List presented to:  C-1 Patient          Status of service:  Completed, signed off  ED Comments:   ED Comments Detail:  This CM contacted the patient via phone and clarified her concerns for no insurance and needing to schedule the GI referral obtained in the ED. The patient did state that she is [name] patient at the [facility] in [place]. Discussed the [facility] charitable program to cover health care costs and referrals since [place] is [name]. The patient stated that this was recommended to her at the [facility] and she stated that she filled out the application and believes that she has received the award letter. This CM then discussed referrals made within the system. The patient stated that she feels like the [facility] will make the referrals. Discussed the [facility] with the patient and explained that they are [name] provider practice in which she can establish care with [name] PCP, and they have financial counselors on site and [name] referral specialist that is familiar with the [facility]. this CM provided the patient with the contact number for [facility] and explained that if she unable to receive the follow up care recommended through the  [facility] that it is recommended she transfer care to the [facility] for further follow up care and treatment. The patient verbalized understanding, repeated the contact number back and had no other questions or concerns.

## 2014-05-19 ENCOUNTER — Encounter (HOSPITAL_COMMUNITY): Payer: Self-pay | Admitting: Emergency Medicine

## 2014-07-22 ENCOUNTER — Encounter (HOSPITAL_COMMUNITY): Payer: Self-pay | Admitting: *Deleted

## 2014-07-22 ENCOUNTER — Inpatient Hospital Stay (HOSPITAL_COMMUNITY)
Admission: AD | Admit: 2014-07-22 | Discharge: 2014-07-22 | Disposition: A | Discharge status: Home / Self Care | Payer: Medicaid Other | Source: Ambulatory Visit | Attending: Obstetrics & Gynecology | Admitting: Obstetrics & Gynecology

## 2014-07-22 ENCOUNTER — Inpatient Hospital Stay (HOSPITAL_COMMUNITY): Payer: Medicaid Other

## 2014-07-22 DIAGNOSIS — K5900 Constipation, unspecified: Secondary | ICD-10-CM | POA: Insufficient documentation

## 2014-07-22 DIAGNOSIS — R109 Unspecified abdominal pain: Secondary | ICD-10-CM

## 2014-07-22 DIAGNOSIS — M545 Low back pain: Secondary | ICD-10-CM | POA: Insufficient documentation

## 2014-07-22 DIAGNOSIS — G8929 Other chronic pain: Secondary | ICD-10-CM

## 2014-07-22 DIAGNOSIS — N920 Excessive and frequent menstruation with regular cycle: Secondary | ICD-10-CM | POA: Insufficient documentation

## 2014-07-22 DIAGNOSIS — E039 Hypothyroidism, unspecified: Secondary | ICD-10-CM | POA: Insufficient documentation

## 2014-07-22 DIAGNOSIS — F17210 Nicotine dependence, cigarettes, uncomplicated: Secondary | ICD-10-CM | POA: Insufficient documentation

## 2014-07-22 DIAGNOSIS — N898 Other specified noninflammatory disorders of vagina: Secondary | ICD-10-CM

## 2014-07-22 DIAGNOSIS — Z114 Encounter for screening for human immunodeficiency virus [HIV]: Secondary | ICD-10-CM | POA: Insufficient documentation

## 2014-07-22 DIAGNOSIS — R1084 Generalized abdominal pain: Secondary | ICD-10-CM | POA: Insufficient documentation

## 2014-07-22 HISTORY — DX: Herpesviral infection, unspecified: B00.9

## 2014-07-22 HISTORY — DX: Papillomavirus as the cause of diseases classified elsewhere: B97.7

## 2014-07-22 LAB — CBC
HCT: 41 % (ref 36.0–46.0)
HEMOGLOBIN: 13.9 g/dL (ref 12.0–15.0)
MCH: 33.2 pg (ref 26.0–34.0)
MCHC: 33.9 g/dL (ref 30.0–36.0)
MCV: 97.9 fL (ref 78.0–100.0)
PLATELETS: 232 10*3/uL (ref 150–400)
RBC: 4.19 MIL/uL (ref 3.87–5.11)
RDW: 12 % (ref 11.5–15.5)
WBC: 10.8 10*3/uL — AB (ref 4.0–10.5)

## 2014-07-22 LAB — WET PREP, GENITAL
Clue Cells Wet Prep HPF POC: NONE SEEN
TRICH WET PREP: NONE SEEN
YEAST WET PREP: NONE SEEN

## 2014-07-22 LAB — URINALYSIS, ROUTINE W REFLEX MICROSCOPIC
BILIRUBIN URINE: NEGATIVE
GLUCOSE, UA: NEGATIVE mg/dL
Hgb urine dipstick: NEGATIVE
Ketones, ur: NEGATIVE mg/dL
Leukocytes, UA: NEGATIVE
Nitrite: NEGATIVE
Protein, ur: NEGATIVE mg/dL
SPECIFIC GRAVITY, URINE: 1.015 (ref 1.005–1.030)
Urobilinogen, UA: 0.2 mg/dL (ref 0.0–1.0)
pH: 6.5 (ref 5.0–8.0)

## 2014-07-22 LAB — RPR

## 2014-07-22 LAB — POCT PREGNANCY, URINE: Preg Test, Ur: NEGATIVE

## 2014-07-22 MED ORDER — IBUPROFEN 800 MG PO TABS: 800.0000 mg | ORAL_TABLET | Freq: Three times a day (TID) | ORAL | Status: DC

## 2014-07-22 MED ORDER — ONDANSETRON HCL 4 MG PO TABS: 4.0000 mg | ORAL_TABLET | Freq: Four times a day (QID) | ORAL | Status: DC

## 2014-07-22 MED ORDER — ONDANSETRON 8 MG PO TBDP
8.0000 mg | ORAL_TABLET | Freq: Once | ORAL | Status: AC
  Administered 2014-07-22: 8 mg via ORAL
  Filled 2014-07-22: qty 1

## 2014-07-22 MED ORDER — NAPROXEN SODIUM 550 MG PO TABS: 550.0000 mg | ORAL_TABLET | Freq: Two times a day (BID) | ORAL | Status: DC

## 2014-07-22 MED ORDER — KETOROLAC TROMETHAMINE 60 MG/2ML IM SOLN
60.0000 mg | Freq: Once | INTRAMUSCULAR | Status: AC
  Administered 2014-07-22: 60 mg via INTRAMUSCULAR
  Filled 2014-07-22: qty 2

## 2014-07-22 NOTE — MAU Note (Signed)
Had appendix removed 4 months ago.  Has been having GI problems, has frequent constipation- when she takes a laxative- she will have cramping/pain for about 3-4 hrs before any results. Feels like something is being ripped inside. Periods have been coming  Regular. Passing clots.  Get those same cramps when has periods, lower abd, low back.  Also has sporadic nausea.

## 2014-07-22 NOTE — MAU Note (Signed)
C/o lower abd pain with back pain. Had appendectomy in September, referred to GI MD. Pt states "did not like MD", no follow up.  C/o constipation, with medication.

## 2014-07-22 NOTE — Discharge Instructions (Signed)
You were seen today for heavy menstrual bleeding and vaginal discharge. You have declined birth control pills at this time and need to be seen by Department of Health to discuss alternative forms of birth control. We have provided you with a list of primary care doctors. You need to follow up for further evaluation of your constipation and back pain. Abdominal Pain, Women Abdominal (stomach, pelvic, or belly) pain can be caused by many things. It is important to tell your doctor:  The location of the pain.  Does it come and go or is it present all the time?  Are there things that start the pain (eating certain foods, exercise)?  Are there other symptoms associated with the pain (fever, nausea, vomiting, diarrhea)? All of this is helpful to know when trying to find the cause of the pain. CAUSES   Stomach: virus or bacteria infection, or ulcer.  Intestine: appendicitis (inflamed appendix), regional ileitis (Crohn's disease), ulcerative colitis (inflamed colon), irritable bowel syndrome, diverticulitis (inflamed diverticulum of the colon), or cancer of the stomach or intestine.  Gallbladder disease or stones in the gallbladder.  Kidney disease, kidney stones, or infection.  Pancreas infection or cancer.  Fibromyalgia (pain disorder).  Diseases of the female organs:  Uterus: fibroid (non-cancerous) tumors or infection.  Fallopian tubes: infection or tubal pregnancy.  Ovary: cysts or tumors.  Pelvic adhesions (scar tissue).  Endometriosis (uterus lining tissue growing in the pelvis and on the pelvic organs).  Pelvic congestion syndrome (female organs filling up with blood just before the menstrual period).  Pain with the menstrual period.  Pain with ovulation (producing an egg).  Pain with an IUD (intrauterine device, birth control) in the uterus.  Cancer of the female organs.  Functional pain (pain not caused by a disease, may improve without treatment).  Psychological  pain.  Depression. DIAGNOSIS  Your doctor will decide the seriousness of your pain by doing an examination.  Blood tests.  X-rays.  Ultrasound.  CT scan (computed tomography, special type of X-ray).  MRI (magnetic resonance imaging).  Cultures, for infection.  Barium enema (dye inserted in the large intestine, to better view it with X-rays).  Colonoscopy (looking in intestine with a lighted tube).  Laparoscopy (minor surgery, looking in abdomen with a lighted tube).  Major abdominal exploratory surgery (looking in abdomen with a large incision). TREATMENT  The treatment will depend on the cause of the pain.   Many cases can be observed and treated at home.  Over-the-counter medicines recommended by your caregiver.  Prescription medicine.  Antibiotics, for infection.  Birth control pills, for painful periods or for ovulation pain.  Hormone treatment, for endometriosis.  Nerve blocking injections.  Physical therapy.  Antidepressants.  Counseling with a psychologist or psychiatrist.  Minor or major surgery. HOME CARE INSTRUCTIONS   Do not take laxatives, unless directed by your caregiver.  Take over-the-counter pain medicine only if ordered by your caregiver. Do not take aspirin because it can cause an upset stomach or bleeding.  Try a clear liquid diet (broth or water) as ordered by your caregiver. Slowly move to a bland diet, as tolerated, if the pain is related to the stomach or intestine.  Have a thermometer and take your temperature several times a day, and record it.  Bed rest and sleep, if it helps the pain.  Avoid sexual intercourse, if it causes pain.  Avoid stressful situations.  Keep your follow-up appointments and tests, as your caregiver orders.  If the pain does not  go away with medicine or surgery, you may try:  Acupuncture.  Relaxation exercises (yoga, meditation).  Group therapy.  Counseling. SEEK MEDICAL CARE IF:   You  notice certain foods cause stomach pain.  Your home care treatment is not helping your pain.  You need stronger pain medicine.  You want your IUD removed.  You feel faint or lightheaded.  You develop nausea and vomiting.  You develop a rash.  You are having side effects or an allergy to your medicine. SEEK IMMEDIATE MEDICAL CARE IF:   Your pain does not go away or gets worse.  You have a fever.  Your pain is felt only in portions of the abdomen. The right side could possibly be appendicitis. The left lower portion of the abdomen could be colitis or diverticulitis.  You are passing blood in your stools (bright red or black tarry stools, with or without vomiting).  You have blood in your urine.  You develop chills, with or without a fever.  You pass out. MAKE SURE YOU:   Understand these instructions.  Will watch your condition.  Will get help right away if you are not doing well or get worse. Document Released: 05/01/2007 Document Revised: 11/18/2013 Document Reviewed: 05/21/2009 [facility] Patient Information 2015 [place], [place]. This information is not intended to replace advice given to you by your health care provider. Make sure you discuss any questions you have with your health care provider.

## 2014-07-22 NOTE — MAU Provider Note (Signed)
History     CSN: [id number]  Arrival date and time: 07/22/14 0948   None     Chief Complaint  Patient presents with  . Abdominal Pain  . Back Pain   HPI 29 y.o.G1P1 presents with multiple complaints. Pt reports constipation x 1 week with generalized abd pain after BM. Pt reports no relief with OTC colace BID and fiber additives. Pt reports laxative use twice weekly and no improvement. Pt reports change in color of stool ranging from brown, orange and green. Pt denies dysuria, hematuria, melena. Seen eval by GI, reports "I didn't like that doctor".   Pt reports s/p appendectomy regular menses lasting 4-5 days with increased bil lower abd cramping pain radiating to back. Pt reports increased vaginal clots, approx half dollar sized. Denies weakness, dizziness or presyncope. Pt reports minimal improvement with OTC ibuprofren. Pt reports new sexual partners and concern for STDs. Pt also reports lumbar back pain with "bulging" of back intermittently x 4months. Pt reports "I think I have a bulging disc". Denies bil lower extremity numbness, tingling, falls or incontinence. Pt reports pain worsens with prolonged standing intermittently. Pt denies PMD or OBGYN at this time.   RN note:  MAU Note 07/22/2014 2:34 PM    Expand All Collapse All   C/o lower abd pain with back pain. Had appendectomy in September, referred to GI MD. Pt states "did not like MD", no follow up. C/o constipation, with medication.           RN note: Had appendix removed 4 months ago. Has been having GI problems, has frequent constipation- when she takes a laxative- she will have cramping/pain for about 3-4 hrs before any results. Feels like something is being ripped inside. Periods have been coming Regular. Passing clots. Get those same cramps when has periods, lower abd, low back. Also has sporadic nausea.     02/27/14 neg CT of abd and pelvis  Past Medical History  Diagnosis Date  . Hyperthyroidism   .  Depression   . Anxiety   . HPV (human papilloma virus) infection   . Herpes simplex type 2 infection     Past Surgical History  Procedure Laterality Date  . Dilation and evacuation  02/02/2012    Procedure: DILATATION AND EVACUATION;  Surgeon: Levi AlandMark E Anderson, MD;  Location: [facility];  Service: Gynecology;  Laterality: N/A;  . Appendectomy    . Dilation and curettage of uterus  2013    Family History  Problem Relation Age of Onset  . Cancer Maternal Aunt   . Hypertension Maternal Grandmother   . Vision loss Maternal Grandmother   . Cancer Maternal Grandmother   . Cancer Paternal Grandmother     History  Substance Use Topics  . Smoking status: Current Every Day Smoker -- 1.00 packs/day    Types: Cigarettes  . Smokeless tobacco: Not on file  . Alcohol Use: No    Allergies:  Allergies  Allergen Reactions  . Bactrim [Sulfamethoxazole-Trimethoprim] Nausea And Vomiting    Prescriptions prior to admission  Medication Sig Dispense Refill Last Dose  . aspirin-acetaminophen-caffeine (EXCEDRIN MIGRAINE) 250-250-65 MG per tablet Take 1 tablet by mouth every 6 (six) hours as needed for migraine.   Past Month at Unknown time  . b complex vitamins tablet Take 1 tablet by mouth daily.   Past Week at Unknown time  . docusate sodium (COLACE) 100 MG capsule Take 200 mg by mouth daily.   07/22/2014 at Unknown time  . ibuprofen (  ADVIL,MOTRIN) 200 MG tablet Take 600 mg by mouth every 6 (six) hours as needed for headache.   07/21/2014 at Unknown time  . Lactobacillus (REPHRESH PRO-B) CAPS Take 1 capsule by mouth daily.   07/22/2014 at Unknown time  . levothyroxine (SYNTHROID, LEVOTHROID) 25 MCG tablet Take 25 mcg by mouth daily before breakfast.   07/22/2014 at Unknown time  . famotidine (PEPCID) 20 MG tablet Take 1 tablet (20 mg total) by mouth 2 (two) times daily. (Patient not taking: Reported on 07/22/2014) 30 tablet 0 Not Taking at Unknown time  . HYDROcodone-acetaminophen (NORCO/VICODIN) 5-325 MG per  tablet Take 1 tablet by mouth every 6 (six) hours as needed for moderate pain. (Patient not taking: Reported on 07/22/2014) 15 tablet 0 Not Taking at Unknown time  . sucralfate (CARAFATE) 1 G tablet Take 1 tablet (1 g total) by mouth 4 (four) times daily -  with meals and at bedtime. (Patient not taking: Reported on 07/22/2014) 28 tablet 0 Not Taking at Unknown time    Review of Systems  Constitutional: Negative for fever, chills, weight loss, malaise/fatigue and diaphoresis.  Respiratory: Negative for cough, hemoptysis, sputum production, shortness of breath and wheezing.   Cardiovascular: Negative for chest pain and palpitations.  Gastrointestinal: Positive for nausea, abdominal pain and constipation. Negative for heartburn, vomiting, diarrhea, blood in stool and melena.  Genitourinary: Negative for dysuria, urgency, frequency, hematuria and flank pain.  Musculoskeletal: Positive for back pain.  Neurological: Negative for weakness.   Physical Exam   Blood pressure 112/67, pulse 61, temperature 98.6 F (37 C), temperature source Oral, resp. rate 16, weight 107 lb (48.535 kg), last menstrual period 07/19/2014.  Physical Exam  Constitutional: She is oriented to person, place, and time. She appears well-developed and well-nourished.  Cardiovascular: Normal rate, normal heart sounds and intact distal pulses.   Respiratory: Breath sounds normal. No respiratory distress. She has no wheezes. She has no rales. She exhibits no tenderness.  GI: Soft. Bowel sounds are normal. She exhibits no distension and no mass. There is no tenderness. There is no rebound and no guarding.  No CVA tenderness  Genitourinary: Uterus normal. Guaiac negative stool. Vaginal discharge found.  NEFG, moderate thin stringy brown vaginal discharge with inflammation of cervix, no vaginal bleeding noted, no CMT.   Musculoskeletal:  Full spinal ROM, pain with palpation of spinous processes.   Neurological: She is alert and  oriented to person, place, and time.  Skin: Skin is warm and dry.  Psychiatric: She has a normal mood and affect. Her behavior is normal. Judgment and thought content normal.    MAU Course  Procedures Results for orders placed or performed during the hospital encounter of 07/22/14 (from the past 24 hour(s))  Urinalysis, Routine w reflex microscopic     Status: None   Collection Time: 07/22/14 10:10 AM  Result Value Ref Range   Color, Urine YELLOW YELLOW   APPearance CLEAR CLEAR   Specific Gravity, Urine 1.015 1.005 - 1.030   pH 6.5 5.0 - 8.0   Glucose, UA NEGATIVE NEGATIVE mg/dL   Hgb urine dipstick NEGATIVE NEGATIVE   Bilirubin Urine NEGATIVE NEGATIVE   Ketones, ur NEGATIVE NEGATIVE mg/dL   Protein, ur NEGATIVE NEGATIVE mg/dL   Urobilinogen, UA 0.2 0.0 - 1.0 mg/dL   Nitrite NEGATIVE NEGATIVE   Leukocytes, UA NEGATIVE NEGATIVE  Pregnancy, urine POC     Status: None   Collection Time: 07/22/14 10:20 AM  Result Value Ref Range   Preg Test, Ur NEGATIVE  NEGATIVE  CBC     Status: Abnormal   Collection Time: 07/22/14 12:00 PM  Result Value Ref Range   WBC 10.8 (H) 4.0 - 10.5 K/uL   RBC 4.19 3.87 - 5.11 MIL/uL   Hemoglobin 13.9 12.0 - 15.0 g/dL   HCT 65.7 84.6 - 96.2 %   MCV 97.9 78.0 - 100.0 fL   MCH 33.2 26.0 - 34.0 pg   MCHC 33.9 30.0 - 36.0 g/dL   RDW 95.2 84.1 - 32.4 %   Platelets 232 150 - 400 K/uL   Results for orders placed or performed during the hospital encounter of 07/22/14 (from the past 24 hour(s))  Urinalysis, Routine w reflex microscopic     Status: None   Collection Time: 07/22/14 10:10 AM  Result Value Ref Range   Color, Urine YELLOW YELLOW   APPearance CLEAR CLEAR   Specific Gravity, Urine 1.015 1.005 - 1.030   pH 6.5 5.0 - 8.0   Glucose, UA NEGATIVE NEGATIVE mg/dL   Hgb urine dipstick NEGATIVE NEGATIVE   Bilirubin Urine NEGATIVE NEGATIVE   Ketones, ur NEGATIVE NEGATIVE mg/dL   Protein, ur NEGATIVE NEGATIVE mg/dL   Urobilinogen, UA 0.2 0.0 - 1.0 mg/dL    Nitrite NEGATIVE NEGATIVE   Leukocytes, UA NEGATIVE NEGATIVE  Pregnancy, urine POC     Status: None   Collection Time: 07/22/14 10:20 AM  Result Value Ref Range   Preg Test, Ur NEGATIVE NEGATIVE  CBC     Status: Abnormal   Collection Time: 07/22/14 12:00 PM  Result Value Ref Range   WBC 10.8 (H) 4.0 - 10.5 K/uL   RBC 4.19 3.87 - 5.11 MIL/uL   Hemoglobin 13.9 12.0 - 15.0 g/dL   HCT 40.1 02.7 - 25.3 %   MCV 97.9 78.0 - 100.0 fL   MCH 33.2 26.0 - 34.0 pg   MCHC 33.9 30.0 - 36.0 g/dL   RDW 66.4 40.3 - 47.4 %   Platelets 232 150 - 400 K/uL  Wet prep, genital     Status: Abnormal   Collection Time: 07/22/14  4:55 PM  Result Value Ref Range   Yeast Wet Prep HPF POC NONE SEEN NONE SEEN   Trich, Wet Prep NONE SEEN NONE SEEN   Clue Cells Wet Prep HPF POC NONE SEEN NONE SEEN   WBC, Wet Prep HPF POC FEW (A) NONE SEEN   US Transvaginal Non-ob  07/22/2014   CLINICAL DATA:  Sharp abdominal pain for 1 year. Previous appendectomy.  EXAM: TRANSABDOMINAL AND TRANSVAGINAL ULTRASOUND OF PELVIS  TECHNIQUE: Both transabdominal and transvaginal ultrasound examinations of the pelvis were performed. Transabdominal technique was performed for global imaging of the pelvis including uterus, ovaries, adnexal regions, and pelvic cul-de-sac. It was necessary to proceed with endovaginal exam following the transabdominal exam to visualize the endometrium.  COMPARISON:  CT 03/23/2014  FINDINGS: Uterus  Measurements: 77 x 32 x 44 mm. No fibroids or other mass visualized.  Endometrium  Thickness: 1.4 mm.  No focal abnormality visualized.  Right ovary  Measurements: 34 x 14 x 18 mm. Normal appearance/no adnexal mass.  Left ovary  Measurements: 38 x 21 x 25 mm. Normal appearance/no adnexal mass.  Other findings  No free fluid.  IMPRESSION: 1. Normal study.   Electronically Signed   By: Oley Balm M.D.   On: 07/22/2014 15:54   US Pelvis Complete  07/22/2014   CLINICAL DATA:  Sharp abdominal pain for 1 year. Previous  appendectomy.  EXAM: TRANSABDOMINAL AND TRANSVAGINAL ULTRASOUND  OF PELVIS  TECHNIQUE: Both transabdominal and transvaginal ultrasound examinations of the pelvis were performed. Transabdominal technique was performed for global imaging of the pelvis including uterus, ovaries, adnexal regions, and pelvic cul-de-sac. It was necessary to proceed with endovaginal exam following the transabdominal exam to visualize the endometrium.  COMPARISON:  CT 03/23/2014  FINDINGS: Uterus  Measurements: 77 x 32 x 44 mm. No fibroids or other mass visualized.  Endometrium  Thickness: 1.4 mm.  No focal abnormality visualized.  Right ovary  Measurements: 34 x 14 x 18 mm. Normal appearance/no adnexal mass.  Left ovary  Measurements: 38 x 21 x 25 mm. Normal appearance/no adnexal mass.  Other findings  No free fluid.  IMPRESSION: 1. Normal study.   Electronically Signed   By: Oley Balm M.D.   On: 07/22/2014 15:54    CBC, HIV, RPR, [place], GC and Wet Prep eval pain and discharge. Pt treated for pain with toradol and zofran.  Assessment and Plan  A:abdominal pain P: Discharge Home Discusses options of OCP to aid in menstrual irregularities and pain, pt declined OCP due to "I get cysts". Pt referred to health department for alternative forms of birth control. Pt provided list of PMD for evaluation of lower back pain and GI referral for constipation. Ibuprofen  PO Q6H PRN pain Zofran  PO Q8H PRN Nausea  Tania Perrott 07/22/2014, 3:03 PM

## 2014-07-23 LAB — GC/CHLAMYDIA PROBE AMP
CT Probe RNA: NEGATIVE
GC Probe RNA: NEGATIVE

## 2014-07-23 LAB — HIV ANTIBODY (ROUTINE TESTING W REFLEX)
HIV 1/HIV 2 AB: NONREACTIVE
HIV 1/O/2 Abs-Index Value: <1 (ref <1.00)

## 2016-04-14 ENCOUNTER — Other Ambulatory Visit: Payer: Self-pay | Admitting: Obstetrics and Gynecology

## 2016-04-14 DIAGNOSIS — E0590 Thyrotoxicosis, unspecified without thyrotoxic crisis or storm: Secondary | ICD-10-CM | POA: Insufficient documentation

## 2016-04-14 DIAGNOSIS — J111 Influenza due to unidentified influenza virus with other respiratory manifestations: Secondary | ICD-10-CM | POA: Insufficient documentation

## 2016-04-14 DIAGNOSIS — J45909 Unspecified asthma, uncomplicated: Secondary | ICD-10-CM | POA: Insufficient documentation

## 2016-04-14 DIAGNOSIS — A6000 Herpesviral infection of urogenital system, unspecified: Secondary | ICD-10-CM | POA: Insufficient documentation

## 2016-04-14 HISTORY — DX: Unspecified asthma, uncomplicated: J45.909

## 2016-04-15 LAB — CYTOLOGY - PAP

## 2016-08-01 DIAGNOSIS — Z720 Tobacco use: Secondary | ICD-10-CM | POA: Diagnosis not present

## 2016-08-01 DIAGNOSIS — D72829 Elevated white blood cell count, unspecified: Secondary | ICD-10-CM | POA: Diagnosis not present

## 2017-02-27 DIAGNOSIS — A0472 Enterocolitis due to Clostridium difficile, not specified as recurrent: Secondary | ICD-10-CM

## 2017-02-27 DIAGNOSIS — R197 Diarrhea, unspecified: Secondary | ICD-10-CM | POA: Diagnosis not present

## 2017-02-28 DIAGNOSIS — R197 Diarrhea, unspecified: Secondary | ICD-10-CM | POA: Diagnosis not present

## 2017-02-28 DIAGNOSIS — A0472 Enterocolitis due to Clostridium difficile, not specified as recurrent: Secondary | ICD-10-CM | POA: Diagnosis not present

## 2017-03-01 DIAGNOSIS — R197 Diarrhea, unspecified: Secondary | ICD-10-CM | POA: Diagnosis not present

## 2017-03-01 DIAGNOSIS — A0472 Enterocolitis due to Clostridium difficile, not specified as recurrent: Secondary | ICD-10-CM | POA: Diagnosis not present

## 2017-03-02 DIAGNOSIS — R197 Diarrhea, unspecified: Secondary | ICD-10-CM | POA: Diagnosis not present

## 2017-03-02 DIAGNOSIS — A0472 Enterocolitis due to Clostridium difficile, not specified as recurrent: Secondary | ICD-10-CM | POA: Diagnosis not present

## 2017-04-14 HISTORY — PX: COLONOSCOPY: SHX174

## 2017-04-14 HISTORY — PX: ESOPHAGOGASTRODUODENOSCOPY: SHX1529

## 2017-07-18 DIAGNOSIS — G47419 Narcolepsy without cataplexy: Secondary | ICD-10-CM

## 2017-07-18 HISTORY — DX: Narcolepsy without cataplexy: G47.419

## 2017-08-24 DIAGNOSIS — G4733 Obstructive sleep apnea (adult) (pediatric): Secondary | ICD-10-CM

## 2017-08-24 DIAGNOSIS — Z9989 Dependence on other enabling machines and devices: Secondary | ICD-10-CM

## 2017-08-24 DIAGNOSIS — Z720 Tobacco use: Secondary | ICD-10-CM | POA: Insufficient documentation

## 2017-08-24 DIAGNOSIS — R42 Dizziness and giddiness: Secondary | ICD-10-CM

## 2017-08-24 DIAGNOSIS — A770 Spotted fever due to Rickettsia rickettsii: Secondary | ICD-10-CM

## 2017-08-24 DIAGNOSIS — R5381 Other malaise: Secondary | ICD-10-CM | POA: Insufficient documentation

## 2017-08-24 HISTORY — DX: Spotted fever due to Rickettsia rickettsii: A77.0

## 2017-08-24 HISTORY — DX: Tobacco use: Z72.0

## 2017-08-24 HISTORY — DX: Dependence on other enabling machines and devices: Z99.89

## 2017-08-24 HISTORY — DX: Dizziness and giddiness: R42

## 2017-08-24 HISTORY — DX: Obstructive sleep apnea (adult) (pediatric): G47.33

## 2018-02-16 DIAGNOSIS — I10 Essential (primary) hypertension: Secondary | ICD-10-CM | POA: Insufficient documentation

## 2018-02-16 HISTORY — DX: Essential (primary) hypertension: I10

## 2018-08-22 DIAGNOSIS — R945 Abnormal results of liver function studies: Secondary | ICD-10-CM | POA: Diagnosis not present

## 2018-08-22 DIAGNOSIS — G43909 Migraine, unspecified, not intractable, without status migrainosus: Secondary | ICD-10-CM

## 2018-08-22 DIAGNOSIS — Z720 Tobacco use: Secondary | ICD-10-CM

## 2018-08-22 DIAGNOSIS — D72829 Elevated white blood cell count, unspecified: Secondary | ICD-10-CM

## 2018-08-22 DIAGNOSIS — R221 Localized swelling, mass and lump, neck: Secondary | ICD-10-CM

## 2018-08-22 DIAGNOSIS — M542 Cervicalgia: Secondary | ICD-10-CM

## 2018-08-22 DIAGNOSIS — R10811 Right upper quadrant abdominal tenderness: Secondary | ICD-10-CM

## 2018-08-22 DIAGNOSIS — J329 Chronic sinusitis, unspecified: Secondary | ICD-10-CM

## 2018-11-26 ENCOUNTER — Ambulatory Visit: Payer: Medicaid Other | Admitting: Gastroenterology

## 2018-11-27 ENCOUNTER — Other Ambulatory Visit: Payer: Self-pay

## 2018-11-27 ENCOUNTER — Telehealth: Payer: Medicaid Other | Admitting: Gastroenterology

## 2018-11-28 NOTE — Progress Notes (Signed)
Prev notes/prev GI eval Amber [name] awaited

## 2018-11-29 ENCOUNTER — Encounter: Payer: Self-pay | Admitting: Gastroenterology

## 2018-11-29 ENCOUNTER — Telehealth (INDEPENDENT_AMBULATORY_CARE_PROVIDER_SITE_OTHER): Payer: Medicaid Other | Admitting: Gastroenterology

## 2018-11-29 ENCOUNTER — Other Ambulatory Visit: Payer: Self-pay

## 2018-11-29 VITALS — Ht 61.0 in | Wt 135.0 lb

## 2018-11-29 DIAGNOSIS — K219 Gastro-esophageal reflux disease without esophagitis: Secondary | ICD-10-CM | POA: Diagnosis not present

## 2018-11-29 DIAGNOSIS — K581 Irritable bowel syndrome with constipation: Secondary | ICD-10-CM | POA: Diagnosis not present

## 2018-11-29 DIAGNOSIS — R1084 Generalized abdominal pain: Secondary | ICD-10-CM

## 2018-11-29 DIAGNOSIS — K625 Hemorrhage of anus and rectum: Secondary | ICD-10-CM

## 2018-11-29 MED ORDER — PANTOPRAZOLE SODIUM 40 MG PO TBEC: 40.0000 mg | DELAYED_RELEASE_TABLET | Freq: Every day | ORAL | 6 refills | Status: DC

## 2018-11-29 MED ORDER — MOVIPREP 100 G PO SOLR: 1.0000 | Freq: Once | ORAL | 0 refills | Status: AC

## 2018-11-29 NOTE — Progress Notes (Signed)
Chief Complaint: Abdominal pain/constipation/rectArdelle Park bleeding/increased WBC count  Referring Provider:  Dr Haque      ASSESSMENT AND PLAN;   #1.  Generalized abdominal pain with H/O rectal bleeding.  Abnormal CT with contrast 11/12/2018 showing questionable enterocolitis. (Rx with metro/cipro). Rpt CT 11/19/2018- neg. Neg colonoscopy 04/14/2017 to TI, negative EGD 04/14/2017  #2.  IBS with constipation. Nl CBC, CMP, lipase 11/12/2018. Remote H/O C.Diff.  #3. GERD.  #4. Co-morbid conditions include hypothyroidism (last TSH was Nl), chronic fatigue, IC, tremor, narcolepsy, asthma and sleep apnea.  #4.  Marijuana/tobacco abuse.  Plan: - Change Omerazole to protonix  po qd (#30, 6 refills). - Proceed with colon with 2 day prep (miralax/moviprep).  I have explained her the risks and benefits including small but definite risks of bleeding, perforation, risks of anesthesia, missing colorectal neoplasms rarely.  Benefits were also discussed.  She wishes to proceed. - Check CBC, CMP, CRP, sed rate, IBD serology. - Stop smoking and marijuana. - Continue milk of magnesia for now.  If she still has problems, will give her a trial of Linzess po qd.  - She will also discuss all medications including Norvasc with Dr. Ardelle Park.  I believe she is taking too many medications.    HPI:    DONEISHA IVEY is a 33 y.o. female Delorise Jackson) With long standing history of lower abdominal pain, constipation with pellet-like stools and abdominal bloating.  She has been diagnosed as having IBS.  Had had EGD and colonoscopy by Dr. Jennye Boroughs 2018 which was negative.  Over the last few weeks she has been having increasing abdominal pain and has been noticing bright red blood per rectum which is mixed with the stool.  She underwent CT scan on 11/12/2018 which showed questionable enterocolitis.  She was treated with metronidazole and Cipro.  Repeat CT scan was unremarkable.  She continues to have some blood  which is better now.  Seen by Dr. Ardelle Park -some concern regarding IBD given that she had slightly increased WBC count.  Advised to get repeat colonoscopy.  Longstanding constipation-would have frequency of 1-2 bowel movement/week.  Despite MiraLAX/Benefiber.  Most recently she has been taking milk of magnesia every day and would have some bowel movement.  She will occasionally have diarrhea especially after taking more MOM.  Has occasional nausea/vomiting.  She has been using marijuana every day.  She continues to smoke despite medical advice.  Has sleeping disorders- narcolepsy/sleep apnea, on ritalin/CPAP.  Does drink sprite - 2-3/day.  Denies use of chewing gums chocolates or mints.  She does stay bloated.  No fever or chills.  No family history of IBD.  Her daughter has IBS with constipation requiring MiraLAX.  Extensive notes were reviewed.   Past Medical History:  Diagnosis Date  . Acute pharyngitis, unspecified   . Anemia   . Anxiety   . Chronic fatigue   . Clotting disorder (HCC)   . Colitis   . Cystitis, unspecified without hematuria   . Depression   . Dyspnea   . GERD (gastroesophageal reflux disease)   . Herpes simplex type 2 infection   . History of Clostridioides difficile colitis   . HPV (human papilloma virus) infection   . Hyperthyroidism   . Migraine headache   . Mild persistent asthma, uncomplicated   . Palpitations   . Raynaud's syndrome without gangrene   . Sleep apnea   . Tick-borne relapsing fever     Past Surgical History:  Procedure Laterality Date  .  APPENDECTOMY    . COLONOSCOPY  04/14/2017   Dr Jennye Boroughs. Normal colonoscopy to the terminal ileum   . DILATION AND CURETTAGE OF UTERUS  2013  . DILATION AND EVACUATION  02/02/2012   Procedure: DILATATION AND EVACUATION;  Surgeon: Levi Aland, MD;  Location: [facility];  Service: Gynecology;  Laterality: N/A;  . ESOPHAGOGASTRODUODENOSCOPY  04/14/2017   Dr. Jennye Boroughs. Normal upper endoscopy.  Empiric esophageal dilation to 50 [place]    Family History  Problem Relation Age of Onset  . Cancer Maternal Aunt   . Hypertension Maternal Grandmother   . Vision loss Maternal Grandmother   . Cancer Maternal Grandmother   . Breast cancer Paternal Grandmother     Social History   Tobacco Use  . Smoking status: Current Every Day Smoker    Packs/day: 1.00    Types: Cigarettes  . Smokeless tobacco: Former Engineer, water Use Topics  . Alcohol use: No  . Drug use: No    Current Outpatient Medications  Medication Sig Dispense Refill  . AMBULATORY NON FORMULARY MEDICATION as directed. CPAP machine    . amLODipine (NORVASC) 5 MG tablet 1 tablet daily.    Marland Kitchen BREO ELLIPTA 200-25 MCG/INH AEPB 1 puff as directed.    . carbidopa-levodopa (SINEMET IR) 25-100 MG tablet Take 1 tablet by mouth daily.    Marland Kitchen dicyclomine (BENTYL) 10 MG capsule 1 capsule as directed.    . fluticasone (FLONASE) 50 MCG/ACT nasal spray 2 sprays daily.    . hydrOXYzine (ATARAX/VISTARIL) 25 MG tablet Take 25 mg by mouth daily.    Marland Kitchen ipratropium-albuterol (DUONEB) 0.5-2.5 (3) MG/3ML SOLN Take 3 mLs by nebulization as directed.    Marland Kitchen levothyroxine (SYNTHROID) 100 MCG tablet 1 tablet daily.    Marland Kitchen LILLOW 0.15-30 MG-MCG tablet Take 1 tablet by mouth daily.    . Magnesium Hydroxide (MILK OF MAGNESIA PO) Take by mouth daily.    . methylphenidate (RITALIN) 20 MG tablet 1 tablet 3 (three) times daily.    . montelukast (SINGULAIR) 10 MG tablet Take 10 mg by mouth daily.    Marland Kitchen omeprazole (PRILOSEC) 20 MG capsule Take 1 capsule by mouth daily.    . ondansetron (ZOFRAN-ODT) 8 MG disintegrating tablet 1 tablet daily.    . Polyethylene Glycol 3350 (PEG 3350) POWD 1 packet as directed.    Marland Kitchen PROAIR HFA 108 (90 Base) MCG/ACT inhaler 2 puffs as directed.    . tiotropium (SPIRIVA HANDIHALER) 18 MCG inhalation capsule Place 18 mcg into inhaler and inhale daily.    . traMADol (ULTRAM) 50 MG tablet Take 50 mg by mouth as needed.    .  valACYclovir (VALTREX) 500 MG tablet 1 tablet daily as needed.     No current facility-administered medications for this visit.     Allergies  Allergen Reactions  . Amoxicillin-Pot Clavulanate Nausea And Vomiting  . Bactrim [Sulfamethoxazole-Trimethoprim] Nausea And Vomiting  . Hydrocodone-Acetaminophen Other (See Comments) and Nausea And Vomiting    Review of Systems:  Constitutional: Denies fever, chills, diaphoresis, appetite change and has fatigue.  HEENT: Does have deviated nasal septum, chronic sinusitis Respiratory: Denies SOB, DOE, cough, chest tightness,  And has occasional wheezing.   Cardiovascular: Denies chest pain, palpitations and leg swelling.  Genitourinary: Denies dysuria, has nocturia with negative UA.  Musculoskeletal: Has  myalgias, back pain, No joint swelling, arthralgias and gait problem.  Skin: No rash.  Neurological: Denies dizziness, seizures, syncope, weakness, light-headedness, numbness and headaches.  Hematological: Denies adenopathy. Easy bruising, personal or  family bleeding history  Psychiatric/Behavioral: Has anxiety, sleep problems    Physical Exam:    Ht 5\' 1"  (1.549 m)   Wt 135 lb (61.2 kg)   BMI 25.51 kg/m  Filed Weights   11/29/18 1408  Weight: 135 lb (61.2 kg)  Not examined since it was a tele-visit   Data Reviewed: I have personally reviewed following labs and imaging studies  CBC: CBC Latest Ref Rng & Units 07/22/2014 02/27/2014 02/02/2012  WBC 4.0 - 10.5 K/uL 10.8(H) 8.1 14.1(H)  Hemoglobin 12.0 - 15.0 g/dL 16.113.9 09.614.2 04.513.8  Hematocrit 36.0 - 46.0 % 41.0 42.1 40.9  Platelets 150 - 400 K/uL 232 216 273    CMP: CMP Latest Ref Rng & Units 02/27/2014 01/17/2012 01/21/2009  Glucose 70 - 99 mg/dL 84 92 74  BUN 6 - 23 mg/dL 9 9 5(L)  Creatinine 4.090.50 - 1.10 mg/dL 8.110.85 9.140.69 7.820.53  Sodium 137 - 147 mEq/L 139 135 133(L)  Potassium 3.7 - 5.3 mEq/L 3.8 3.8 3.5  Chloride 96 - 112 mEq/L 99 99 102  CO2 19 - 32 mEq/L 28 26 25   Calcium 8.4 - 10.5  mg/dL 9.5 95.610.4 8.7  Total Protein 6.0 - 8.3 g/dL 7.2 - 6.2  Total Bilirubin 0.3 - 1.2 mg/dL 0.6 - 0.5  Alkaline Phos 39 - 117 U/L 65 - 71  AST 0 - 37 U/L 35 - 32  ALT 0 - 35 U/L 29 - 30   This service was provided via telemedicine.  The patient was located at home.  The provider was located in office.  The patient did consent to this telephone visit and is aware of possible charges through their insurance for this visit.  The patient was referred by Dr. Ardelle ParkHaque.   Time spent on call/coordination of care/review of extensive records: 45 min     Edman Circleaj Dann Ventress, MD 11/29/2018, 2:58 PM  Cc: Dr Ardelle ParkHaque

## 2018-11-29 NOTE — Patient Instructions (Addendum)
If you are age 32 or older, your body mass index should be between 23-30. Your Body mass index is 25.51 kg/m. If this is out of the aforementioned range listed, please consider follow up with your Primary Care Provider.  If you are age 93 or younger, your body mass index should be between 19-25. Your Body mass index is 25.51 kg/m. If this is out of the aformentioned range listed, please consider follow up with your Primary Care Provider.   We have sent the following medications to your pharmacy for you to pick up at your convenience: Protonix  We have given you samples of the following medication to take: Miralax  Please go to the lab on the 2nd floor [address] for your scheduled appointment.  You have been scheduled for a colonoscopy. Please follow written instructions given to you at your visit today.  Please pick up your prep supplies at the pharmacy within the next 1-3 days. If you use inhalers (even only as needed), please bring them with you on the day of your procedure. Your physician has requested that you go to www.startemmi.com and enter the access code given to you at your visit today. This web site gives a general overview about your procedure. However, you should still follow specific instructions given to you by our office regarding your preparation for the procedure.  To help prevent the possible spread of infection to our patients, communities, and staff; we will be implementing the following measures:  As of now we are not allowing any visitors/family members to accompany you to any upcoming appointments with [facility]. If you have any concerns about this please contact our office to discuss prior to the appointment.   Two days before your procedure: Mix 3 packs (or capfuls) of Miralax in 48 ounces of clear liquid and drink at 6pm.   Thank you,  Dr. Lynann Bologna

## 2018-12-05 ENCOUNTER — Telehealth: Payer: Self-pay | Admitting: *Deleted

## 2018-12-05 NOTE — Telephone Encounter (Signed)
Attempted to reach pt to prescreen for COVID symptoms prior to lab appt tomorrow. Left message to return my call.

## 2018-12-05 NOTE — Telephone Encounter (Signed)
Patient is calling back [place]. She said no COIVD symtpoms. But Nicki Gu[place]ara can call her back tomorrow. Thanks

## 2018-12-05 NOTE — Telephone Encounter (Signed)
Patient is calling back to speak to [place]. There are no Coivd symtoms. She can call her back tomorrow. Thank you

## 2018-12-06 ENCOUNTER — Other Ambulatory Visit: Payer: Self-pay

## 2018-12-06 ENCOUNTER — Other Ambulatory Visit (INDEPENDENT_AMBULATORY_CARE_PROVIDER_SITE_OTHER): Payer: Medicaid Other

## 2018-12-06 DIAGNOSIS — K581 Irritable bowel syndrome with constipation: Secondary | ICD-10-CM

## 2018-12-06 DIAGNOSIS — R1084 Generalized abdominal pain: Secondary | ICD-10-CM | POA: Diagnosis not present

## 2018-12-06 NOTE — Telephone Encounter (Signed)
FYI

## 2018-12-07 LAB — COMPREHENSIVE METABOLIC PANEL
ALT: 20 U/L (ref 0–35)
AST: 23 U/L (ref 0–37)
Albumin: 4.4 g/dL (ref 3.5–5.2)
Alkaline Phosphatase: 64 U/L (ref 39–117)
BUN: 8 mg/dL (ref 6–23)
CO2: 27 mEq/L (ref 19–32)
Calcium: 9.7 mg/dL (ref 8.4–10.5)
Chloride: 101 mEq/L (ref 96–112)
Creatinine, Ser: 0.97 mg/dL (ref 0.40–1.20)
GFR: 66.54 mL/min (ref >60.00)
Glucose, Bld: 79 mg/dL (ref 70–99)
Potassium: 4.2 mEq/L (ref 3.5–5.1)
Sodium: 137 mEq/L (ref 135–145)
Total Bilirubin: 0.6 mg/dL (ref 0.2–1.2)
Total Protein: 7.3 g/dL (ref 6.0–8.3)

## 2018-12-07 LAB — C-REACTIVE PROTEIN: CRP: 2 mg/dL (ref 0.5–20.0)

## 2018-12-07 LAB — SEDIMENTATION RATE: Sed Rate: 33 mm/hr — ABNORMAL HIGH (ref 0–20)

## 2018-12-11 LAB — IBD EXPANDED PANEL
ACCA: 24 units (ref 0–90)
ALCA: 4 units (ref 0–60)
AMCA: 14 units (ref 0–100)
Atypical pANCA: NEGATIVE
gASCA: 8 units (ref 0–50)

## 2018-12-12 ENCOUNTER — Telehealth: Payer: Self-pay | Admitting: Gastroenterology

## 2018-12-12 NOTE — Telephone Encounter (Signed)
See result note.  

## 2018-12-12 NOTE — Telephone Encounter (Signed)
Pt return your call.

## 2018-12-31 ENCOUNTER — Ambulatory Visit: Payer: Self-pay | Admitting: *Deleted

## 2018-12-31 NOTE — Telephone Encounter (Signed)
Pt had questions regarding her colonoscopy for Friday. Advised her to call the GI provider tomorrow for clarification on the prep. She voiced understanding.

## 2018-12-31 NOTE — Telephone Encounter (Signed)
Message from Beverley Fiedler sent at 12/31/2018 4:16 PM EDT  Summary: Clinical Advice   Patient needs to speak with a nurse on the instructions for her PROPOFOL COLON on Friday. Please advise.          Called patient and she is not at home and would like to be called later so she can get the information written down.

## 2019-01-01 ENCOUNTER — Telehealth: Payer: Self-pay | Admitting: Gastroenterology

## 2019-01-01 NOTE — Telephone Encounter (Signed)
Patient is following up on previous message

## 2019-01-01 NOTE — Telephone Encounter (Signed)
Pt would like a call back to discuss prep instruction.

## 2019-01-01 NOTE — Telephone Encounter (Signed)
I have called and went over instructions with patient. Patient voiced understanding.  

## 2019-01-03 ENCOUNTER — Telehealth: Payer: Self-pay | Admitting: Gastroenterology

## 2019-01-03 NOTE — Telephone Encounter (Signed)

## 2019-01-03 NOTE — Telephone Encounter (Signed)
All answer are no °

## 2019-01-04 ENCOUNTER — Other Ambulatory Visit: Payer: Self-pay

## 2019-01-04 ENCOUNTER — Encounter: Payer: Self-pay | Admitting: Gastroenterology

## 2019-01-04 ENCOUNTER — Ambulatory Visit (AMBULATORY_SURGERY_CENTER): Payer: Medicaid Other | Admitting: Gastroenterology

## 2019-01-04 VITALS — BP 123/64 | HR 61 | Temp 99.0°F | Resp 17 | Ht 61.0 in | Wt 135.0 lb

## 2019-01-04 DIAGNOSIS — R1084 Generalized abdominal pain: Secondary | ICD-10-CM | POA: Diagnosis not present

## 2019-01-04 DIAGNOSIS — R933 Abnormal findings on diagnostic imaging of other parts of digestive tract: Secondary | ICD-10-CM

## 2019-01-04 DIAGNOSIS — K648 Other hemorrhoids: Secondary | ICD-10-CM

## 2019-01-04 MED ORDER — SODIUM CHLORIDE 0.9 % IV SOLN: 500.0000 mL | Freq: Once | INTRAVENOUS | Status: DC

## 2019-01-04 NOTE — Op Note (Signed)
[facility] Patient Name: Amber [name] Procedure Date: 01/04/2019 10:24 AM MRN: [id number] Endoscopist: Lynann Bolognaajesh Coline Calkin , MD Age: [age] Referring MD:  Date of Birth: 01/17/1986 Gender: Female Account #: [id number] Procedure:                Colonoscopy Indications:              Generalized abdominal pain with H/O rectal                            bleeding. Abnormal CT with contrast 11/12/2018                            showing questionable enterocolitis. (Rx with                            metro/cipro). History of constipation. Medicines:                Monitored Anesthesia Care Procedure:                Pre-Anesthesia Assessment:                           - Prior to the procedure, a History and Physical                            was performed, and patient medications and                            allergies were reviewed. The patient's tolerance of                            previous anesthesia was also reviewed. The risks                            and benefits of the procedure and the sedation                            options and risks were discussed with the patient.                            All questions were answered, and informed consent                            was obtained. Prior Anticoagulants: The patient has                            taken no previous anticoagulant or antiplatelet                            agents. ASA Grade Assessment: I - A normal, healthy                            patient. After reviewing the risks and benefits,  the patient was deemed in satisfactory condition to                            undergo the procedure.                           After obtaining informed consent, the colonoscope                            was passed under direct vision. Throughout the                            procedure, the patient's blood pressure, pulse, and                            oxygen saturations were monitored continuously. The                             Colonoscope was introduced through the anus and                            advanced to the 4 cm into the ileum. The                            colonoscopy was performed without difficulty. The                            patient tolerated the procedure well. The quality                            of the bowel preparation was good. The terminal                            ileum, ileocecal valve, appendiceal orifice, and                            rectum were photographed. Scope In: 10:26:46 AM Scope Out: 10:37:52 AM Scope Withdrawal Time: 0 hours 8 minutes 28 seconds  Total Procedure Duration: 0 hours 11 minutes 6 seconds  Findings:                 Non-bleeding internal hemorrhoids were found during                            retroflexion and during perianal exam. The                            hemorrhoids were small.                           The colon (entire examined portion) appeared                            normal. Biopsies for histology were taken with a  cold forceps for evaluation of microscopic colitis.                            Estimated blood loss: none.                           The terminal ileum appeared normal. Biopsies were                            taken with a cold forceps for histology. Estimated                            blood loss: none.                           The exam was otherwise without abnormality on                            direct and retroflexion views. Complications:            No immediate complications. Estimated Blood Loss:     Estimated blood loss: none. Impression:               -Small internal hemorrhoids.                           -Otherwise normal colonoscopy to TI. Recommendation:           - Patient has a contact number available for                            emergencies. The signs and symptoms of potential                            delayed complications were discussed with the                             patient. Return to normal activities tomorrow.                            Written discharge instructions were provided to the                            patient.                           - Resume previous diet.                           - Continue present medications.                           - Await pathology results.                           - Miralax 1 capful (17 grams) in 8 ounces of water  PO daily.                           - Return to GI clinic in 12 weeks. Lynann Bolognaajesh Leonia Heatherly, MD 01/04/2019 10:43:55 AM This report has been signed electronically.

## 2019-01-04 NOTE — Patient Instructions (Signed)
Miralax 1 capful (17 gms) in 8 ozs water by mouth daily ( over the counter)  Await pathology results    YOU HAD AN ENDOSCOPIC PROCEDURE TODAY AT [place]:   Refer to the procedure report that was given to you for any specific questions about what was found during the examination.  If the procedure report does not answer your questions, please call your gastroenterologist to clarify.  If you requested that your care partner not be given the details of your procedure findings, then the procedure report has been included in a sealed envelope for you to review at your convenience later.  YOU SHOULD EXPECT: Some feelings of bloating in the abdomen. Passage of more gas than usual.  Walking can help get rid of the air that was put into your GI tract during the procedure and reduce the bloating. If you had a lower endoscopy (such as a colonoscopy or flexible sigmoidoscopy) you may notice spotting of blood in your stool or on the toilet paper. If you underwent a bowel prep for your procedure, you may not have a normal bowel movement for a few days.  Please Note:  You might notice some irritation and congestion in your nose or some drainage.  This is from the oxygen used during your procedure.  There is no need for concern and it should clear up in a day or so.  SYMPTOMS TO REPORT IMMEDIATELY:   Following lower endoscopy (colonoscopy or flexible sigmoidoscopy):  Excessive amounts of blood in the stool  Significant tenderness or worsening of abdominal pains  Swelling of the abdomen that is new, acute  Fever of 100F or higher    For urgent or emergent issues, a gastroenterologist can be reached at any hour by calling [phone].   DIET:  We do recommend a small meal at first, but then you may proceed to your regular diet.  Drink plenty of fluids but you should avoid alcoholic beverages for 24 hours.  ACTIVITY:  You should plan to take it easy for the rest of today and you  should NOT DRIVE or use heavy machinery until tomorrow (because of the sedation medicines used during the test).    FOLLOW UP: Our staff will call the number listed on your records 48-72 hours following your procedure to check on you and address any questions or concerns that you may have regarding the information given to you following your procedure. If we do not reach you, we will leave a message.  We will attempt to reach you two times.  During this call, we will ask if you have developed any symptoms of COVID 19. If you develop any symptoms (ie: fever, flu-like symptoms, shortness of breath, cough etc.) before then, please call [phone].  If you test positive for Covid 19 in the 2 weeks post procedure, please call and report this information to [place].    If any biopsies were taken you will be contacted by phone or by letter within the next 1-3 weeks.  Please call us at [phone] if you have not heard about the biopsies in 3 weeks.    SIGNATURES/CONFIDENTIALITY: You and/or your care partner have signed paperwork which will be entered into your electronic medical record.  These signatures attest to the fact that that the information above on your After Visit Summary has been reviewed and is understood.  Full responsibility of the confidentiality of this discharge information lies with you and/or your care-partner.

## 2019-01-04 NOTE — Progress Notes (Signed)
A and O x3. Report to RN. Tolerated MAC anesthesia well.

## 2019-01-04 NOTE — Progress Notes (Signed)
Called to room to assist during endoscopic procedure.  Patient ID and intended procedure confirmed with present staff. Received instructions for my participation in the procedure from the performing physician.  

## 2019-01-04 NOTE — Progress Notes (Signed)
Amber [name]- Temp [name]- vitals 

## 2019-01-08 ENCOUNTER — Telehealth: Payer: Self-pay | Admitting: *Deleted

## 2019-01-08 NOTE — Telephone Encounter (Signed)
1. Have you developed a fever since your procedure? no  2.   Have you had an respiratory symptoms (SOB or cough) since your procedure? no  3.   Have you tested positive for COVID 19 since your procedure no  4.   Have you had any family members/close contacts diagnosed with the COVID 19 since your procedure?  no   If yes to any of these questions please route to Joylene John, RN and Alphonsa Gin, Therapist, sports.  Follow up Call-  Call back number 01/04/2019  Post procedure Call Back phone  # [phone]  Permission to leave phone message Yes  Some recent data might be hidden     Patient questions:  Do you have a fever, pain , or abdominal swelling? No. Pain Score  0 *  Have you tolerated food without any problems? Yes.    Have you been able to return to your normal activities? Yes.    Do you have any questions about your discharge instructions: Diet   No. Medications  No. Follow up visit  No.  Do you have questions or concerns about your Care? No.  Actions: * If pain score is 4 or above: No action needed, pain <4.

## 2019-01-14 ENCOUNTER — Encounter: Payer: Self-pay | Admitting: Gastroenterology

## 2019-02-13 DIAGNOSIS — R601 Generalized edema: Secondary | ICD-10-CM

## 2019-03-08 DIAGNOSIS — F329 Major depressive disorder, single episode, unspecified: Secondary | ICD-10-CM | POA: Insufficient documentation

## 2019-04-03 ENCOUNTER — Ambulatory Visit (INDEPENDENT_AMBULATORY_CARE_PROVIDER_SITE_OTHER): Payer: Medicaid Other | Admitting: Cardiology

## 2019-04-03 ENCOUNTER — Other Ambulatory Visit: Payer: Self-pay

## 2019-04-03 VITALS — BP 128/80 | HR 83 | Ht 61.0 in | Wt 146.0 lb

## 2019-04-03 DIAGNOSIS — R002 Palpitations: Secondary | ICD-10-CM | POA: Insufficient documentation

## 2019-04-03 DIAGNOSIS — E119 Type 2 diabetes mellitus without complications: Secondary | ICD-10-CM | POA: Diagnosis not present

## 2019-04-03 DIAGNOSIS — I10 Essential (primary) hypertension: Secondary | ICD-10-CM | POA: Diagnosis not present

## 2019-04-03 DIAGNOSIS — K219 Gastro-esophageal reflux disease without esophagitis: Secondary | ICD-10-CM | POA: Insufficient documentation

## 2019-04-03 DIAGNOSIS — Z01812 Encounter for preprocedural laboratory examination: Secondary | ICD-10-CM

## 2019-04-03 DIAGNOSIS — R079 Chest pain, unspecified: Secondary | ICD-10-CM

## 2019-04-03 DIAGNOSIS — L940 Localized scleroderma [morphea]: Secondary | ICD-10-CM

## 2019-04-03 DIAGNOSIS — E039 Hypothyroidism, unspecified: Secondary | ICD-10-CM

## 2019-04-03 DIAGNOSIS — I7300 Raynaud's syndrome without gangrene: Secondary | ICD-10-CM | POA: Insufficient documentation

## 2019-04-03 HISTORY — DX: Hypothyroidism, unspecified: E03.9

## 2019-04-03 HISTORY — DX: Raynaud's syndrome without gangrene: I73.00

## 2019-04-03 HISTORY — DX: Localized scleroderma (morphea): L94.0

## 2019-04-03 MED ORDER — METOPROLOL TARTRATE 50 MG PO TABS: ORAL_TABLET | ORAL | 0 refills | Status: DC

## 2019-04-03 NOTE — Progress Notes (Signed)
Cardiology Office Note:    Date:  04/03/2019   ID:  Amber [name], DOB 04/04/1986, MRN [id number]  PCP:  Bonnita Nasuti, MD  Cardiologist:  No primary care provider on file.  Electrophysiologist:  None   Referring MD: Bonnita Nasuti, MD   The patient was referred for chest pain, shortness of breath and palpitations  History of Present Illness:    Amber [name] is a 33 y.o. female with a hypertension diagnosed 1 year ago, diabetes type 2, hyperlipidemia, raynaud's syndrome, anxiety disorder presents today to be evaluated for chest pain, and palpitations.  The patient reports for about 2 to 3 months now she has been experiencing diffuse chest pain which starts in mid chest and radiates towards bilateral breast area.  She states that it is a dull burning pain that last about 5 to 10 minutes.  She admits to associated shortness of breath.  Denies any aggravating or relieving factor.  She notes that recently is getting worse as it does happen when she is just sitting there without doing any activity.  She reported experiencing the pain at least once a week.  In addition she tells me about her palpitations.  She states that it is an gradual onset of racing heartbeats which can last for 10 to 30 seconds.  She denies any associated lightheadedness dizziness.  She denies any chest pain in the office today.  Past Medical History:  Diagnosis Date  . Acute pharyngitis, unspecified   . Anemia   . Anxiety   . Asthma 04/14/2016  . Chronic fatigue   . Clotting disorder ([place])   . Colitis   . Cystitis, unspecified without hematuria   . Depression   . Dizziness 08/24/2017  . Dyspnea   . GERD (gastroesophageal reflux disease)   . Herpes simplex type 2 infection   . History of Clostridioides difficile colitis   . HPV (human papilloma virus) infection   . Hypertensive disorder 02/16/2018  . Hyperthyroidism   . Hypothyroidism 04/03/2019  . 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 Migraine  headache   . Mild persistent asthma, uncomplicated   . Narcolepsy 2019  . Obstructive sleep apnea on CPAP 08/24/2017  . Palpitations   . Raynaud's disease without gangrene 04/03/2019  . Raynaud's syndrome without gangrene   . [facility] spotted fever) 08/24/2017  . Sleep apnea   . Tick-borne relapsing fever   . Tobacco abuse 08/24/2017    Past Surgical History:  Procedure Laterality Date  . APPENDECTOMY    . COLONOSCOPY  04/14/2017   Dr Lyda Jester. Normal colonoscopy to the terminal ileum   . DILATION AND CURETTAGE OF UTERUS  2013  . DILATION AND EVACUATION  02/02/2012   Procedure: DILATATION AND EVACUATION;  Surgeon: Olga Millers, MD;  Location: [facility];  Service: Gynecology;  Laterality: N/A;  . ESOPHAGOGASTRODUODENOSCOPY  04/14/2017   Dr. Lyda Jester. Normal upper endoscopy. Empiric esophageal dilation to 50 [place]    Current Medications: Current Meds  Medication Sig  . AMBULATORY NON FORMULARY MEDICATION as directed. CPAP machine  . amLODipine (NORVASC) 5 MG tablet 1 tablet daily.  . clobetasol ointment (TEMOVATE) 0.05 % APPLY TO AFFECTED AREA TWICE DAILY X2 3 WEEKS THEN AS NEEDED  . dicyclomine (BENTYL) 10 MG capsule Take 1 capsule by mouth daily.   . fluticasone (FLONASE) 50 MCG/ACT nasal spray Place 1 spray into both nostrils daily.   . Fluticasone-Umeclidin-Vilant (TRELEGY ELLIPTA) 100-62.5-25 MCG/INH AEPB Inhale 1 puff into the lungs daily.  Marland Kitchen  furosemide (LASIX) 40 MG tablet Take 40 mg by mouth daily.  . hydrOXYzine (ATARAX/VISTARIL) 25 MG tablet Take 25 mg by mouth daily.  Marland Kitchen ipratropium-albuterol (DUONEB) 0.5-2.5 (3) MG/3ML SOLN Take 3 mLs by nebulization as directed.  Marland Kitchen levothyroxine (SYNTHROID) 100 MCG tablet 1 tablet daily.  Marland Kitchen LILLOW 0.15-30 MG-MCG tablet Take 1 tablet by mouth daily.  . Magnesium Hydroxide (MILK OF MAGNESIA PO) Take by mouth daily. 4 tablespoons daily  . methylphenidate (RITALIN) 20 MG tablet 1 tablet 3 (three) times daily.  . montelukast  (SINGULAIR) 10 MG tablet Take 10 mg by mouth daily.  . ondansetron (ZOFRAN-ODT) 8 MG disintegrating tablet Take 1 tablet by mouth daily.   . pantoprazole (PROTONIX) 40 MG tablet Take 1 tablet (40 mg total) by mouth daily.  Marland Kitchen PROAIR HFA 108 (90 Base) MCG/ACT inhaler Inhale 2 puffs into the lungs every 4 (four) hours as needed.   . traMADol (ULTRAM) 50 MG tablet Take 50 mg by mouth as needed.  . valACYclovir (VALTREX) 500 MG tablet 1 tablet daily as needed.     Allergies:   Amoxicillin-pot clavulanate and Bactrim [sulfamethoxazole-trimethoprim]   Social History   Socioeconomic History  . Marital status: Single    Spouse name: Not on file  . Number of children: Not on file  . Years of education: Not on file  . Highest education level: Not on file  Occupational History  . Not on file  Social Needs  . Financial resource strain: Not on file  . Food insecurity    Worry: Not on file    Inability: Not on file  . Transportation needs    Medical: Not on file    Non-medical: Not on file  Tobacco Use  . Smoking status: Current Every Day Smoker    Packs/day: 1.00    Years: 16.00    Pack years: 16.00    Types: Cigarettes  . Smokeless tobacco: Never Used  Substance and Sexual Activity  . Alcohol use: No  . Drug use: Yes    Types: Marijuana  . Sexual activity: Yes  Lifestyle  . Physical activity    Days per week: Not on file    Minutes per session: Not on file  . Stress: Not on file  Relationships  . Social Musician on phone: Not on file    Gets together: Not on file    Attends religious service: Not on file    Active member of club or organization: Not on file    Attends meetings of clubs or organizations: Not on file    Relationship status: Not on file  Other Topics Concern  . Not on file  Social History Narrative  . Not on file     Family History: The patient's family history includes Breast cancer in her paternal grandmother; Cancer in her maternal aunt and  maternal grandmother; Heart disease in her father; Hypertension in her maternal grandmother; Vision loss in her maternal grandmother.  ROS:      ROS   EKGs/Labs/Other Studies Reviewed:    The following studies were reviewed today:   EKG:  The ekg ordered today demonstrates sinus rhythm, heart rate 83 bpm with arrhythmia, RSR prime with left atrial abnormality.  No previous EKG for comparison.  Recent Labs: 12/06/2018: ALT 20; BUN 8; Creatinine, Ser 0.97; Potassium 4.2; Sodium 137  Recent Lipid Panel No results found for: CHOL, TRIG, HDL, CHOLHDL, VLDL, LDLCALC, LDLDIRECT  Physical Exam:    VS:  BP 128/80 (BP Location: Right Arm, Patient Position: Sitting, Cuff Size: Normal)   Pulse 83   Ht 5\' 1"  (1.549 m)   Wt 146 lb (66.2 kg)   SpO2 99%   BMI 27.59 kg/m     Wt Readings from Last 3 Encounters:  04/03/19 146 lb (66.2 kg)  01/04/19 135 lb (61.2 kg)  11/29/18 135 lb (61.2 kg)     GEN: Appears older than stated age. well developed in no acute distress HEENT: Poor dentition, missing upper teeth NECK: No JVD; No carotid bruits LYMPHATICS: No lymphadenopathy CARDIAC: RRR, no murmurs, rubs, gallops RESPIRATORY:  Clear to auscultation without rales, wheezing or rhonchi  ABDOMEN: Soft, non-tender, non-distended EXTREMITIES: Trace ankle edema, no cyanosis, no clubbing MUSCULOSKELETAL: No deformity  SKIN: Warm and dry NEUROLOGIC:  Alert and oriented x 3 PSYCHIATRIC:  Normal affect   ASSESSMENT:    1. Pre-procedure lab exam   2. Chest pain, unspecified type   3. Essential hypertension   4. Type 2 diabetes mellitus without complication, without long-term current use of insulin (HCC)   5. Palpitations    PLAN:    In order of problems listed above:  1. She will be undergoing CTA coronaries to evaluate patient for coronary disease.  She does have risk factors: Diabetes, hypertension, family history, current smoker. 2. The patient brought with her report from her ambulatory  monitoring performed in July 2020 which I reviewed with her average heart rate at 114 bpm, minimum heart rate 66 bpm, maximum heart rate 160 there were no evidence of PVCs there was no evidence of PACs however they were several episodes of sinus tachycardia. 3. I did educate patient that her sinus tachycardia is likely secondary to her anxiety.  At this time we need to treat her underlying condition which is anxiety to hopefully improve the tachycardia. 4. For shortness of breath, transthoracic echocardiogram will be appropriate to evaluate LV and RV function and for any elevated right-sided pressures in the setting of her rheumatologic disease as well.  5. The patient is in agreement with the plan.She will follow-up in 3 months.   Medication Adjustments/Labs and Tests Ordered: Current medicines are reviewed at length with the patient today.  Concerns regarding medicines are outlined above.  Orders Placed This Encounter  Procedures  . CT CORONARY FRACTIONAL FLOW RESERVE DATA PREP  . CT CORONARY FRACTIONAL FLOW RESERVE FLUID ANALYSIS  . CT CORONARY MORPH W/CTA COR W/SCORE W/CA W/CM &/OR WO/CM  . Basic Metabolic Panel (BMET)  . Lipid Profile  . EKG 12-Lead  . ECHOCARDIOGRAM COMPLETE   Meds ordered this encounter  Medications  . metoprolol tartrate (LOPRESSOR) 50 MG tablet    Sig: Take 2 tabs (100mg ) 2 hours prior to CT    Dispense:  2 tablet    Refill:  0    Patient Instructions  Medication Instructions:  Your physician recommends that you continue on your current medications as directed. Please refer to the Current Medication list given to you today.  If you need a refill on your cardiac medications before your next appointment, please call your pharmacy.   Lab work: Your physician recommends that you return for lab work in:   3-7 days prior to CT: BMP  Prior to 3 month appointment : Fasting lipid  If you have labs (blood work) drawn today and your tests are completely normal,  you will receive your results only by: Marland Kitchen MyChart Message (if you have MyChart) OR . A paper copy  in the mail If you have any lab test that is abnormal or we need to change your treatment, we will call you to review the results.  Testing/Procedures: Your physician has requested that you have an echocardiogram. Echocardiography is a painless test that uses sound waves to create images of your heart. It provides your doctor with information about the size and shape of your heart and how well your heart's chambers and valves are working. This procedure takes approximately one hour. There are no restrictions for this procedure.  Your physician has requested that you have cardiac CT. Cardiac computed tomography (CT) is a painless test that uses an x-ray machine to take clear, detailed pictures of your heart. For further information please visit [email or web address]. Please follow instruction sheet as given.  Your cardiac CT will be scheduled at one of the below locations:   [facility] [address] [place], [place] [address] [phone]  If scheduled at [facility], please arrive [facility] the [facility] main entrance of [facility] 30-45 minutes prior to test start time. Proceed to the [facility] Radiology Department (first floor) to check-in and test prep.    Please follow these instructions carefully (unless otherwise directed):    On the Night Before the Test: . Be sure to Drink plenty of water. . Do not consume any caffeinated/decaffeinated beverages or chocolate 12 hours prior to your test. . Do not take any antihistamines 12 hours prior to your test.  On the Day of the Test: . Drink plenty of water. Do not drink any water within one hour of the test. . Do not eat any food 4 hours prior to the test. . You may take your regular medications prior to the test.  . Take metoprolol (Lopressor) two hours prior to test. . FEMALES- please wear underwire-free bra  if available).                  -If HR is less than 55 BPM- No Beta Blocker                -IF HR is greater than 55 BPM and patient is less than or equal to 32 yrs old Lopressor 100mg  x1.   After the Test: . Drink plenty of water. . After receiving IV contrast, you may experience a mild flushed feeling. This is normal. . On occasion, you may experience a mild rash up to 24 hours after the test. This is not dangerous. If this occurs, you can take Benadryl 25 mg and increase your fluid intake. . If you experience trouble breathing, this can be serious. If it is severe call 911 IMMEDIATELY. If it is mild, please call our office. .    Please contact the cardiac imaging nurse navigator should you have any questions/concerns Rockwell AlexandriaSara Wallace, RN Navigator Cardiac Imaging [facility] [phone] Office  [phone] Cell    Follow-Up: At [facility], you and your health needs are our priority.  As part of our continuing mission to provide you with exceptional heart care, we have created designated Provider Care Teams.  These Care Teams include your primary Cardiologist (physician) and Advanced Practice Providers (APPs -  Physician Assistants and Nurse Practitioners) who all work together to provide you with the care you need, when you need it. You will need a follow up appointment in 3 months with Dr Servando Salinaobb. Any Other Special Instructions Will Be Listed Below (If Applicable).  Osvaldo ShipperSigned, Nicolus Ose, DO  04/03/2019 10:24 AM    [facility]

## 2019-04-03 NOTE — Patient Instructions (Signed)
Medication Instructions:  Your physician recommends that you continue on your current medications as directed. Please refer to the Current Medication list given to you today.  If you need a refill on your cardiac medications before your next appointment, please call your pharmacy.   Lab work: Your physician recommends that you return for lab work in:   3-7 days prior to CT: BMP  Prior to 3 month appointment : Fasting lipid  If you have labs (blood work) drawn today and your tests are completely normal, you will receive your results only by: Marland Kitchen. MyChart Message (if you have MyChart) OR . A paper copy in the mail If you have any lab test that is abnormal or we need to change your treatment, we will call you to review the results.  Testing/Procedures: Your physician has requested that you have an echocardiogram. Echocardiography is a painless test that uses sound waves to create images of your heart. It provides your doctor with information about the size and shape of your heart and how well your heart's chambers and valves are working. This procedure takes approximately one hour. There are no restrictions for this procedure.  Your physician has requested that you have cardiac CT. Cardiac computed tomography (CT) is a painless test that uses an x-ray machine to take clear, detailed pictures of your heart. For further information please visit [email or web address]. Please follow instruction sheet as given.  Your cardiac CT will be scheduled at one of the below locations:   [facility] [address] [place], [place] [address] [phone]  If scheduled at [facility], please [facility] at the [facility] main entrance of [facility] 30-45 minutes prior to test start time. Proceed to the [facility] Radiology Department (first floor) to check-in and test prep.    Please follow these instructions carefully (unless otherwise directed):    On the Night Before the  Test: . Be sure to Drink plenty of water. . Do not consume any caffeinated/decaffeinated beverages or chocolate 12 hours prior to your test. . Do not take any antihistamines 12 hours prior to your test.  On the Day of the Test: . Drink plenty of water. Do not drink any water within one hour of the test. . Do not eat any food 4 hours prior to the test. . You may take your regular medications prior to the test.  . Take metoprolol (Lopressor) two hours prior to test. . FEMALES- please wear underwire-free bra if available).                  -If HR is less than 55 BPM- No Beta Blocker                -IF HR is greater than 55 BPM and patient is less than or equal to 32 yrs old Lopressor 100mg  x1.   After the Test: . Drink plenty of water. . After receiving IV contrast, you may experience a mild flushed feeling. This is normal. . On occasion, you may experience a mild rash up to 24 hours after the test. This is not dangerous. If this occurs, you can take Benadryl 25 mg and increase your fluid intake. . If you experience trouble breathing, this can be serious. If it is severe call 911 IMMEDIATELY. If it is mild, please call our office. .    Please contact the cardiac imaging nurse navigator should you have any questions/concerns Rockwell AlexandriaSara Wallace, RN Navigator Cardiac Imaging [facility] [phone] Office  [phone] Cell    Follow-Up: At [facility], you and your health needs are our priority.  As part of our continuing mission to provide you with exceptional heart care, we have created designated Provider Care Teams.  These Care Teams include your primary Cardiologist (physician) and Advanced Practice Providers (APPs -  Physician Assistants and Nurse Practitioners) who all work together to provide you with the care you need, when you need it. You will need a follow up appointment in 3 months with Dr Harriet Masson. Any Other Special Instructions Will Be Listed Below (If  Applicable).

## 2019-04-04 ENCOUNTER — Ambulatory Visit (INDEPENDENT_AMBULATORY_CARE_PROVIDER_SITE_OTHER): Payer: Medicaid Other

## 2019-04-04 ENCOUNTER — Telehealth: Payer: Self-pay | Admitting: Cardiology

## 2019-04-04 DIAGNOSIS — R079 Chest pain, unspecified: Secondary | ICD-10-CM

## 2019-04-04 NOTE — Telephone Encounter (Signed)
Patient has medicaid and the medicine that she needs to take before the CT it will not pay for.. what is she to do?

## 2019-04-04 NOTE — Progress Notes (Signed)
Complete echocardiogram has been performed.  Jimmy [place] Wayson RDCS, RVT 

## 2019-04-05 ENCOUNTER — Telehealth: Payer: Self-pay | Admitting: Cardiology

## 2019-04-05 NOTE — Telephone Encounter (Signed)
Telephone call to patient. Left message to return callcall to patient

## 2019-04-05 NOTE — Telephone Encounter (Signed)
Pt returned call. States medicaid won't pay. Explained that metoprolol is a generic and she is only buying 2 pills. She has to have them for the Cardiac Cta procedure. States she will just purchase the pills out of pocket. No further questions.

## 2019-04-05 NOTE — Telephone Encounter (Signed)
Patient returned your call, please call.

## 2019-04-05 NOTE — Telephone Encounter (Signed)
Spoke with patient. Please see previous  Call encounter

## 2019-04-05 NOTE — Telephone Encounter (Signed)
Telephone call to patient. Left message to return call. 

## 2019-04-09 DIAGNOSIS — R519 Headache, unspecified: Secondary | ICD-10-CM | POA: Insufficient documentation

## 2019-04-09 DIAGNOSIS — G43909 Migraine, unspecified, not intractable, without status migrainosus: Secondary | ICD-10-CM | POA: Insufficient documentation

## 2019-05-07 LAB — BASIC METABOLIC PANEL
BUN/Creatinine Ratio: 14 (ref 9–23)
BUN: 12 mg/dL (ref 6–20)
CO2: 22 mmol/L (ref 20–29)
Calcium: 9.3 mg/dL (ref 8.7–10.2)
Chloride: 97 mmol/L (ref 96–106)
Creatinine, Ser: 0.87 mg/dL (ref 0.57–1.00)
GFR calc Af Amer: 102 mL/min/{1.73_m2} (ref >59)
GFR calc non Af Amer: 88 mL/min/{1.73_m2} (ref >59)
Glucose: 104 mg/dL — ABNORMAL HIGH (ref 65–99)
Potassium: 3.8 mmol/L (ref 3.5–5.2)
Sodium: 135 mmol/L (ref 134–144)

## 2019-05-09 ENCOUNTER — Telehealth (HOSPITAL_COMMUNITY): Payer: Self-pay | Admitting: Emergency Medicine

## 2019-05-09 ENCOUNTER — Telehealth: Payer: Self-pay | Admitting: *Deleted

## 2019-05-09 NOTE — Telephone Encounter (Signed)
Reaching out to patient to offer assistance regarding upcoming cardiac imaging study; pt verbalizes understanding of appt date/time, parking situation and where to check in, pre-test NPO status and medications ordered, and verified current allergies; name and call back number provided for further questions should they arise Marchia Bond RN [place] [facility] [phone] office [phone] cell  Asked patient to hold furosemide and all inhalers prior to CTA appt; pt verbalized understanding

## 2019-05-09 NOTE — Telephone Encounter (Signed)
-----   Message from Berniece Salines, DO sent at 05/08/2019 10:44 PM EDT ----- There is slightly elevated glucose please notify patient.

## 2019-05-09 NOTE — Telephone Encounter (Signed)
Telephone call to patient. Left message regarding lab results and to call with any questions. 

## 2019-05-10 ENCOUNTER — Telehealth: Payer: Self-pay | Admitting: Cardiology

## 2019-05-10 ENCOUNTER — Ambulatory Visit (HOSPITAL_COMMUNITY)
Admission: RE | Admit: 2019-05-10 | Discharge: 2019-05-10 | Disposition: A | Discharge status: Home / Self Care | Payer: Medicaid Other | Source: Ambulatory Visit | Attending: Cardiology | Admitting: Cardiology

## 2019-05-10 NOTE — Progress Notes (Signed)
PT comes to hospital for procedure today. She states that her doctor has written her a note that she is not required to wear a mask. Per [facility], pt is required to wear a mask while in the hospital. This is the hospital policy.

## 2019-05-10 NOTE — Telephone Encounter (Signed)
I was notified by our nursing team that the patient refused to wear a mask when asked.  She was advised that if she is not going to be wearing the mask will be able to undergo her testing.  The patient still refused to wear her mask. She was then asked to leave as the patient was not following policy.  I did call the patient to understand why she opted to leave without getting her testing done.  She did tell me " I do go other places to get testing done and nobody asked me why mask therefore I was not going to be wearing masks to get the CT done"  I explained to patient that is was hospital policy. I further advised her that we are willing to reschedule her test if she will wear her mask to get her testing done.  At this time the patient has declined this offer.  She tells me she is going to look for another facility who will be able to accept her to get her CTA without wearing her mask.  I advised the patient that I will be willing to write a prescription for her to get her CTA done as such facilities.  Meanwhile I have advised the patient that if she changes her mind and is  willing to wear a mask to get her testing done to call the office so we can reschedule her.

## 2019-05-13 ENCOUNTER — Telehealth: Payer: Self-pay | Admitting: Cardiology

## 2019-05-13 NOTE — Telephone Encounter (Signed)
Patient is ready to try to go ahead and have the test .. she needs another script for meds too.Marland Kitchen

## 2019-05-14 ENCOUNTER — Telehealth: Payer: Self-pay | Admitting: Cardiology

## 2019-05-14 NOTE — Telephone Encounter (Signed)
Patient is ready to try to go ahead and have the test .. she needs another script for meds too..  °

## 2019-05-15 MED ORDER — METOPROLOL TARTRATE 50 MG PO TABS: ORAL_TABLET | ORAL | 0 refills | Status: DC

## 2019-05-15 NOTE — Addendum Note (Signed)
Addended by: Particia Nearing B on: 05/15/2019 08:35 AM   Modules accepted: Orders

## 2019-05-15 NOTE — Telephone Encounter (Signed)
Spoke with patient. States has already rescheduled Cta for 05/31/19 and metoprolol has been refilled at [facility]. Pt verbalizes undrstanding.

## 2019-05-15 NOTE — Telephone Encounter (Signed)
Patient has rescheduled  For 05/31/19 and metoprolol refilled.

## 2019-05-29 ENCOUNTER — Telehealth (HOSPITAL_COMMUNITY): Payer: Self-pay | Admitting: Emergency Medicine

## 2019-05-29 NOTE — Telephone Encounter (Signed)
Reaching out to patient to offer assistance regarding upcoming cardiac imaging study; pt verbalizes understanding of appt date/time, parking situation and where to check in, pre-test NPO status and medications ordered, and verified current allergies; name and call back number provided for further questions should they arise Marchia Bond RN Navigator Cardiac Imaging [facility] [phone] office [phone] cell  Pt states she was able to refill and pick up prescription for PO metoprolol for test, reminded to take this 2 hr prior to appt. Pt also reminded that [facility] is still enforcing mask usage to everyone who enters any of the facilities, pt verbalized understanding

## 2019-05-31 ENCOUNTER — Ambulatory Visit (HOSPITAL_COMMUNITY)
Admission: RE | Admit: 2019-05-31 | Discharge: 2019-05-31 | Disposition: A | Discharge status: Home / Self Care | Payer: Medicaid Other | Source: Ambulatory Visit | Attending: Cardiology | Admitting: Cardiology

## 2019-05-31 ENCOUNTER — Other Ambulatory Visit: Payer: Self-pay

## 2019-05-31 DIAGNOSIS — R079 Chest pain, unspecified: Secondary | ICD-10-CM | POA: Diagnosis not present

## 2019-05-31 MED ORDER — NITROGLYCERIN 0.4 MG SL SUBL
0.8000 mg | SUBLINGUAL_TABLET | Freq: Once | SUBLINGUAL | Status: AC
  Administered 2019-05-31: 0.8 mg via SUBLINGUAL

## 2019-05-31 MED ORDER — NITROGLYCERIN 0.4 MG SL SUBL
SUBLINGUAL_TABLET | SUBLINGUAL | Status: AC
  Filled 2019-05-31: qty 2

## 2019-05-31 MED ORDER — IOHEXOL 350 MG/ML SOLN
100.0000 mL | Freq: Once | INTRAVENOUS | Status: AC | PRN
  Administered 2019-05-31: 100 mL via INTRAVENOUS

## 2019-06-05 ENCOUNTER — Encounter: Payer: Self-pay | Admitting: *Deleted

## 2019-07-03 ENCOUNTER — Other Ambulatory Visit: Payer: Self-pay

## 2019-07-03 ENCOUNTER — Ambulatory Visit (INDEPENDENT_AMBULATORY_CARE_PROVIDER_SITE_OTHER): Payer: Medicaid Other | Admitting: Cardiology

## 2019-07-03 ENCOUNTER — Encounter: Payer: Self-pay | Admitting: Cardiology

## 2019-07-03 ENCOUNTER — Ambulatory Visit: Payer: Medicaid Other | Admitting: Cardiology

## 2019-07-03 VITALS — BP 132/78 | HR 104 | Ht 61.0 in | Wt 160.1 lb

## 2019-07-03 DIAGNOSIS — R000 Tachycardia, unspecified: Secondary | ICD-10-CM

## 2019-07-03 DIAGNOSIS — I10 Essential (primary) hypertension: Secondary | ICD-10-CM

## 2019-07-03 DIAGNOSIS — R600 Localized edema: Secondary | ICD-10-CM | POA: Diagnosis not present

## 2019-07-03 DIAGNOSIS — F17200 Nicotine dependence, unspecified, uncomplicated: Secondary | ICD-10-CM

## 2019-07-03 DIAGNOSIS — R002 Palpitations: Secondary | ICD-10-CM

## 2019-07-03 MED ORDER — METOPROLOL SUCCINATE ER 25 MG PO TB24: 12.5000 mg | ORAL_TABLET | Freq: Every day | ORAL | 1 refills | Status: DC

## 2019-07-03 MED ORDER — FUROSEMIDE 20 MG PO TABS: ORAL_TABLET | ORAL | 1 refills | Status: DC

## 2019-07-03 NOTE — Patient Instructions (Signed)
Medication Instructions:  Your physician has recommended you make the following change in your medication:   START: TOPROL XL (metoprolol succinate ) 12.5 mg Take 1 tab daily START; Furosemide 20 mg Take 1 tab daily as needed for SOB,edema,weight gain  *If you need a refill on your cardiac medications before your next appointment, please call your pharmacy*  Lab Work: Your physician recommends that you return for lab work in: TODAY BMP.MAgnesium  If you have labs (blood work) drawn today and your tests are completely normal, you will receive your results only by: Marland Kitchen MyChart Message (if you have MyChart) OR . A paper copy in the mail If you have any lab test that is abnormal or we need to change your treatment, we will call you to review the results.  Testing/Procedures: None  Follow-Up: At [facility], you and your health needs are our priority.  As part of our continuing mission to provide you with exceptional heart care, we have created designated Provider Care Teams.  These Care Teams include your primary Cardiologist (physician) and Advanced Practice Providers (APPs -  Physician Assistants and Nurse Practitioners) who all work together to provide you with the care you need, when you need it.  Your next appointment:   3 month(s)  The format for your next appointment:   In Person  Provider:   Thomasene Ripple, DO  Other Instructions Metoprolol extended-release tablets What is this medicine? METOPROLOL (me TOE proe lole) is a beta-blocker. Beta-blockers reduce the workload on the heart and help it to beat more regularly. This medicine is used to treat high blood pressure and to prevent chest pain. It is also used to after a heart attack and to prevent an additional heart attack from occurring. This medicine may be used for other purposes; ask your health care provider or pharmacist if you have questions. COMMON BRAND NAME(S): toprol, Toprol XL What should I tell my health care  provider before I take this medicine? They need to know if you have any of these conditions:  diabetes  heart or vessel disease like slow heart rate, worsening heart failure, heart block, sick sinus syndrome or Raynaud's disease  kidney disease  liver disease  lung or breathing disease, like asthma or emphysema  pheochromocytoma  thyroid disease  an unusual or allergic reaction to metoprolol, other beta-blockers, medicines, foods, dyes, or preservatives  pregnant or trying to get pregnant  breast-feeding How should I use this medicine? Take this medicine by mouth with a glass of water. Follow the directions on the prescription label. Do not crush or chew. Take this medicine with or immediately after meals. Take your doses at regular intervals. Do not take more medicine than directed. Do not stop taking this medicine suddenly. This could lead to serious heart-related effects. Talk to your pediatrician regarding the use of this medicine in children. While this drug may be prescribed for children as young as 6 years for selected conditions, precautions do apply. Overdosage: If you think you have taken too much of this medicine contact a poison control center or emergency room at once. NOTE: This medicine is only for you. Do not share this medicine with others. What if I miss a dose? If you miss a dose, take it as soon as you can. If it is almost time for your next dose, take only that dose. Do not take double or extra doses. What may interact with this medicine? This medicine may interact with the following medications:  certain  medicines for blood pressure, heart disease, irregular heart beat  certain medicines for depression, like monoamine oxidase (MAO) inhibitors, fluoxetine, or paroxetine  clonidine  dobutamine  epinephrine  isoproterenol  reserpine This list may not describe all possible interactions. Give your health care provider a list of all the medicines, herbs,  non-prescription drugs, or dietary supplements you use. Also tell them if you smoke, drink alcohol, or use illegal drugs. Some items may interact with your medicine. What should I watch for while using this medicine? Visit your doctor or health care professional for regular check ups. Contact your doctor right away if your symptoms worsen. Check your blood pressure and pulse rate regularly. Ask your health care professional what your blood pressure and pulse rate should be, and when you should contact them. You may get drowsy or dizzy. Do not drive, use machinery, or do anything that needs mental alertness until you know how this medicine affects you. Do not sit or stand up quickly, especially if you are an older patient. This reduces the risk of dizzy or fainting spells. Contact your doctor if these symptoms continue. Alcohol may interfere with the effect of this medicine. Avoid alcoholic drinks. This medicine may increase blood sugar. Ask your healthcare provider if changes in diet or medicines are needed if you have diabetes. What side effects may I notice from receiving this medicine? Side effects that you should report to your doctor or health care professional as soon as possible:  allergic reactions like skin rash, itching or hives  cold or numb hands or feet  depression  difficulty breathing  faint  fever with sore throat  irregular heartbeat, chest pain  rapid weight gain   signs and symptoms of high blood sugar such as being more thirsty or hungry or having to urinate more than normal. You may also feel very tired or have blurry vision.  swollen legs or ankles Side effects that usually do not require medical attention (report to your doctor or health care professional if they continue or are bothersome):  anxiety or nervousness  change in sex drive or performance  dry skin  headache  nightmares or trouble sleeping  short term memory loss  stomach upset or  diarrhea This list may not describe all possible side effects. Call your doctor for medical advice about side effects. You may report side effects to FDA at 1-800-FDA-1088. Where should I keep my medicine? Keep out of the reach of children. Store at room temperature between 15 and 30 degrees C (59 and 86 degrees F). Throw away any unused medicine after the expiration date. NOTE: This sheet is a summary. It may not cover all possible information. If you have questions about this medicine, talk to your doctor, pharmacist, or health care provider.  2020 Elsevier/Gold Standard (2018-04-24 11:09:41)

## 2019-07-03 NOTE — Progress Notes (Signed)
Cardiology Office Note:    Date:  07/03/2019   ID:  Amber [name], DOB Aug 28, 1985, MRN [id number]  PCP:  Galvin Proffer, MD  Cardiologist:  Thomasene Ripple, DO  Electrophysiologist:  None   Referring MD: Galvin Proffer, MD   Follow-up visit  History of Present Illness:    Amber [name] is a 33 y.o. female with a hypertension diagnosed 1 year ago, diabetes type 2, hyperlipidemia, raynaud's syndrome, anxiety disorder presents today to be evaluated for chest pain, and palpitations.    During her initial visit I was able to review her monitor report with her which was performed in July 2020 that showed that her heart rate went as high as 160 otherwise no evidence of PACs, PVCs or any atrial arrhythmias.  At the time I did educate the patient that her sinus tachycardia was secondary likely to her anxiety.  For the complaint shortness of breath echocardiogram was ordered and a CTA due to family history and her other medical conditions.  In the interim the patient was able to get her CTA as well as her echocardiogram.  Today she is here for follow-up visit she tells me that she has been having worsening palpitations. She denies any chest pain. She also informed me that she has been taken of Lasix and was now taking Aldactone. She is still short of breat at times.   Past Medical History:  Diagnosis Date  . Acute pharyngitis, unspecified   . Anemia   . Anxiety   . Asthma 04/14/2016  . Chronic fatigue   . Clotting disorder (HCC)   . Colitis   . Cystitis, unspecified without hematuria   . Depression   . Dizziness 08/24/2017  . Dyspnea   . GERD (gastroesophageal reflux disease)   . Herpes simplex type 2 infection   . History of Clostridioides difficile colitis   . HPV (human papilloma virus) infection   . Hypertensive disorder 02/16/2018  . Hyperthyroidism   . Hypothyroidism 04/03/2019  . 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 Migraine headache   . Mild persistent asthma,  uncomplicated   . Narcolepsy 2019  . Obstructive sleep apnea on CPAP 08/24/2017  . Palpitations   . Raynaud's disease without gangrene 04/03/2019  . Raynaud's syndrome without gangrene   . [facility] spotted fever) 08/24/2017  . Sleep apnea   . Tick-borne relapsing fever   . Tobacco abuse 08/24/2017    Past Surgical History:  Procedure Laterality Date  . APPENDECTOMY    . COLONOSCOPY  04/14/2017   Dr Jennye Boroughs. Normal colonoscopy to the terminal ileum   . DILATION AND CURETTAGE OF UTERUS  2013  . DILATION AND EVACUATION  02/02/2012   Procedure: DILATATION AND EVACUATION;  Surgeon: Levi Aland, MD;  Location: [facility];  Service: Gynecology;  Laterality: N/A;  . ESOPHAGOGASTRODUODENOSCOPY  04/14/2017   Dr. Jennye Boroughs. Normal upper endoscopy. Empiric esophageal dilation to 50 [place]    Current Medications: Current Meds  Medication Sig  . AMBULATORY NON FORMULARY MEDICATION as directed. CPAP machine  . amitriptyline (ELAVIL) 100 MG tablet Take 100 mg by mouth at bedtime.  Marland Kitchen amLODipine (NORVASC) 10 MG tablet Take 10 mg by mouth daily.  Marland Kitchen dicyclomine (BENTYL) 10 MG capsule Take 1 capsule by mouth daily.   . fluticasone (FLONASE) 50 MCG/ACT nasal spray Place 1 spray into both nostrils daily.   . Fluticasone-Umeclidin-Vilant (TRELEGY ELLIPTA) 100-62.5-25 MCG/INH AEPB Inhale 1 puff into the lungs daily.  Verlee Rossetti (EMGALITY [place])  Inject into the skin.  Marland Kitchen. ipratropium-albuterol (DUONEB) 0.5-2.5 (3) MG/3ML SOLN Take 3 mLs by nebulization as directed.  Marland Kitchen. levothyroxine (SYNTHROID) 100 MCG tablet 1 tablet daily.  Marland Kitchen. LILLOW 0.15-30 MG-MCG tablet Take 1 tablet by mouth daily.  . Magnesium Hydroxide (MILK OF MAGNESIA PO) Take by mouth daily. 4 tablespoons daily  . methylphenidate (RITALIN) 20 MG tablet 1 tablet 3 (three) times daily.  . montelukast (SINGULAIR) 10 MG tablet Take 10 mg by mouth daily.  . ondansetron (ZOFRAN-ODT) 8 MG disintegrating tablet Take 1 tablet by mouth  daily.   . pantoprazole (PROTONIX) 40 MG tablet Take 1 tablet (40 mg total) by mouth daily.  . pregabalin (LYRICA) 150 MG capsule Take 150 mg by mouth 2 (two) times daily.  Marland Kitchen. PROAIR HFA 108 (90 Base) MCG/ACT inhaler Inhale 2 puffs into the lungs every 4 (four) hours as needed.   Marland Kitchen. spironolactone (ALDACTONE) 100 MG tablet Take 100 mg by mouth daily.  . traMADol (ULTRAM) 50 MG tablet Take 50 mg by mouth as needed.  . valACYclovir (VALTREX) 500 MG tablet 1 tablet daily as needed.     Allergies:   Amoxicillin-pot clavulanate and Bactrim [sulfamethoxazole-trimethoprim]   Social History   Socioeconomic History  . Marital status: Single    Spouse name: Not on file  . Number of children: Not on file  . Years of education: Not on file  . Highest education level: Not on file  Occupational History  . Not on file  Tobacco Use  . Smoking status: Current Every Day Smoker    Packs/day: 1.00    Years: 16.00    Pack years: 16.00    Types: Cigarettes  . Smokeless tobacco: Never Used  Substance and Sexual Activity  . Alcohol use: No  . Drug use: Yes    Types: Marijuana  . Sexual activity: Yes  Other Topics Concern  . Not on file  Social History Narrative  . Not on file   Social Determinants of Health   Financial Resource Strain:   . Difficulty of Paying Living Expenses: Not on file  Food Insecurity:   . Worried About Programme researcher, broadcasting/film/videounning Out of Food in the Last Year: Not on file  . Ran Out of Food in the Last Year: Not on file  Transportation Needs:   . Lack of Transportation (Medical): Not on file  . Lack of Transportation (Non-Medical): Not on file  Physical Activity:   . Days of Exercise per Week: Not on file  . Minutes of Exercise per Session: Not on file  Stress:   . Feeling of Stress : Not on file  Social Connections:   . Frequency of Communication with Friends and Family: Not on file  . Frequency of Social Gatherings with Friends and Family: Not on file  . Attends Religious Services:  Not on file  . Active Member of Clubs or Organizations: Not on file  . Attends BankerClub or Organization Meetings: Not on file  . Marital Status: Not on file     Family History: The patient's family history includes Breast cancer in her paternal grandmother; Cancer in her maternal aunt and maternal grandmother; Heart disease in her father; Hypertension in her maternal grandmother; Vision loss in her maternal grandmother.  ROS:   Review of Systems  Constitution: Negative for decreased appetite, fever and weight gain.  HENT: Negative for congestion, ear discharge, hoarse voice and sore throat.   Eyes: Negative for discharge, redness, vision loss in right eye and visual halos.  Cardiovascular: Negative for chest pain, dyspnea on exertion, leg swelling, orthopnea and palpitations.  Respiratory: Negative for cough, hemoptysis, shortness of breath and snoring.   Endocrine: Negative for heat intolerance and polyphagia.  Hematologic/Lymphatic: Negative for bleeding problem. Does not bruise/bleed easily.  Skin: Negative for flushing, nail changes, rash and suspicious lesions.  Musculoskeletal: Negative for arthritis, joint pain, muscle cramps, myalgias, neck pain and stiffness.  Gastrointestinal: Negative for abdominal pain, bowel incontinence, diarrhea and excessive appetite.  Genitourinary: Negative for decreased libido, genital sores and incomplete emptying.  Neurological: Negative for brief paralysis, focal weakness, headaches and loss of balance.  Psychiatric/Behavioral: Negative for altered mental status, depression and suicidal ideas.  Allergic/Immunologic: Negative for HIV exposure and persistent infections.    EKGs/Labs/Other Studies Reviewed:    The following studies were reviewed today:   EKG: None today  Transthoracic echocardiogram IMPRESSIONS  1. Left ventricular ejection fraction, by visual estimation, is 60 to 65%. The left ventricle has normal function. Normal left ventricular  size. There is no left ventricular hypertrophy.  2. Global right ventricle has normal systolic function.The right ventricular size is normal. No increase in right ventricular wall thickness.  3. Left atrial size was normal.  4. Right atrial size was normal.  5. The mitral valve is normal in structure. Trace mitral valve regurgitation. No evidence of mitral stenosis.  6. The tricuspid valve is normal in structure. Tricuspid valve regurgitation was not visualized by color flow Doppler.  7. The aortic valve is normal in structure. Aortic valve regurgitation was not visualized by color flow Doppler. Structurally normal aortic valve, with no evidence of sclerosis or stenosis.  8. The pulmonic valve was normal in structure. Pulmonic valve regurgitation is not visualized by color flow Doppler.  9. The inferior vena cava is normal in size with greater than 50% respiratory variability, suggesting right atrial pressure of 3 mmHg.  Coronary CTA FINDINGS: Coronary calcium score: The patient's coronary artery calcium score is 0, which places the patient in the 0 percentile.  Coronary arteries: Normal coronary origins.  Right dominance.  Right Coronary Artery: Normal caliber vessel, gives rise to PDA. No significant plaque or stenosis.  Left Main Coronary Artery: Very short trunk, common ostium but then almost immediately bifurcates. No significant plaque or stenosis.  Left Anterior Descending Coronary Artery: Normal caliber vessel. No significant plaque or stenosis. Gives rise to 1 medium diagonal branch.  Left Circumflex Artery: Normal caliber vessel. No significant plaque or stenosis. Gives rise to 1 large OM branches.  Aorta: Normal size, 26 mm at the mid ascending aorta (level of the PA bifurcation) measured double oblique. No calcifications. No dissection.  Aortic Valve: No calcifications. Trileaflet.  Other findings:  Normal pulmonary vein drainage into the left  atrium.  Normal left atrial appendage without a thrombus.  Normal size of the pulmonary artery.  IMPRESSION: 1. No evidence of CAD, CADRADS = 0.  2. Coronary calcium score of 0. This was 0 percentile for age and sex matched control.  3. Normal coronary origin with right dominance.  4. Overall small caliber vessels without any plaque or stenosis.  Recent Labs: 12/06/2018: ALT 20 05/06/2019: BUN 12; Creatinine, Ser 0.87; Potassium 3.8; Sodium 135  Recent Lipid Panel No results found for: CHOL, TRIG, HDL, CHOLHDL, VLDL, LDLCALC, LDLDIRECT  Physical Exam:    VS:  BP 132/78   Pulse (!) 104   Ht  (1.549 m)   Wt 160 lb 1.6 oz (72.6 kg)   BMI 30.25 kg/m  Wt Readings from Last 3 Encounters:  07/03/19 160 lb 1.6 oz (72.6 kg)  04/03/19 146 lb (66.2 kg)  01/04/19 135 lb (61.2 kg)     GEN: Well nourished, well developed in no acute distress HEENT: Normal NECK: No JVD; No carotid bruits LYMPHATICS: No lymphadenopathy CARDIAC: S1S2 noted,RRR, no murmurs, rubs, gallops RESPIRATORY:  Clear to auscultation without rales, wheezing or rhonchi  ABDOMEN: Soft, non-tender, non-distended, +bowel sounds, no guarding. EXTREMITIES: bilateral ankle edema.  No cyanosis, no clubbing MUSCULOSKELETAL:  No edema; No deformity  SKIN: Warm and dry NEUROLOGIC:  Alert and oriented x 3, non-focal PSYCHIATRIC:  Normal affect, good insight  ASSESSMENT:    1. Essential hypertension   2. Bilateral leg edema   3. Sinus tachycardia   4. Palpitations   5. Current smoker    PLAN:    She is very concern about the symptoms from her palpitations. Ideally her anxiety is the suspect underlying cause as her monitor reported sinus tachycardia. At this time I can not rule out inappropriate sinus tachycardia.  Due to the bothersome symptoms, I will treat the patient with low dose beta blocker ( toprol xl 12.5mg ). I am hoping she will be able to get some symptomatic relief.   Since the start of  Aldactone she still continues to gain some weight. PRN Lasix 20mg  daily has been ordered. She was educated to take a dose on Lasix if she gain 3lbs in 2 days and 5 lbs in one week.   The patient was counseled on tobacco cessation today for 5 minutes.  Counseling included reviewing the risks of smoking tobacco products, how it impacts the patient's current medical diagnoses and different strategies for quitting.  Pharmacotherapy to aid in tobacco cessation was not prescribed today. The patient coordinate with  primary care provider.  The patient was also advised to call  1-800-QUIT-NOW [phone])337-8209) for additional help with quitting smoking.   Blood work to monitor her electrolytes: BMP and Mag will be performed today.  The patient is in agreement with the above plan. The patient left the office in stable condition.  The patient will follow up in 3 months or sooner if needed.  Medication Adjustments/Labs and Tests Ordered: Current medicines are reviewed at length with the patient today.  Concerns regarding medicines are outlined above.  Orders Placed This Encounter  Procedures  . Basic Metabolic Panel (BMET)  . Magnesium   Meds ordered this encounter  Medications  . metoprolol succinate (TOPROL XL) 25 MG 24 hr tablet    Sig: Take 0.5 tablets (12.5 mg total) by mouth daily.    Dispense:  45 tablet    Refill:  1  . furosemide (LASIX) 20 MG tablet    Sig: Take 1 tab 20 mg daily as needed    Dispense:  60 tablet    Refill:  1    Patient Instructions  Medication Instructions:  Your physician has recommended you make the following change in your medication:   START: TOPROL XL (metoprolol succinate ) 12.5 mg Take 1 tab daily START; Furosemide 20 mg Take 1 tab daily as needed for SOB,edema,weight gain  *If you need a refill on your cardiac medications before your next appointment, please call your pharmacy*  Lab Work: Your physician recommends that you return for lab work in:  TODAY BMP.MAgnesium  If you have labs (blood work) drawn today and your tests are completely normal, you will receive your results only by: [phone] MyChart Message (if you have  MyChart) OR . A paper copy in the mail If you have any lab test that is abnormal or we need to change your treatment, we will call you to review the results.  Testing/Procedures: None  Follow-Up: At [facility], you and your health needs are our priority.  As part of our continuing mission to provide you with exceptional heart care, we have created designated Provider Care Teams.  These Care Teams include your primary Cardiologist (physician) and Advanced Practice Providers (APPs -  Physician Assistants and Nurse Practitioners) who all work together to provide you with the care you need, when you need it.  Your next appointment:   3 month(s)  The format for your next appointment:   In Person  Provider:   Thomasene Ripple, DO  Other Instructions Metoprolol extended-release tablets What is this medicine? METOPROLOL (me TOE proe lole) is a beta-blocker. Beta-blockers reduce the workload on the heart and help it to beat more regularly. This medicine is used to treat high blood pressure and to prevent chest pain. It is also used to after a heart attack and to prevent an additional heart attack from occurring. This medicine may be used for other purposes; ask your health care provider or pharmacist if you have questions. COMMON BRAND NAME(S): toprol, Toprol XL What should I tell my health care provider before I take this medicine? They need to know if you have any of these conditions:  diabetes  heart or vessel disease like slow heart rate, worsening heart failure, heart block, sick sinus syndrome or Raynaud's disease  kidney disease  liver disease  lung or breathing disease, like asthma or emphysema  pheochromocytoma  thyroid disease  an unusual or allergic reaction to metoprolol, other beta-blockers,  medicines, foods, dyes, or preservatives  pregnant or trying to get pregnant  breast-feeding How should I use this medicine? Take this medicine by mouth with a glass of water. Follow the directions on the prescription label. Do not crush or chew. Take this medicine with or immediately after meals. Take your doses at regular intervals. Do not take more medicine than directed. Do not stop taking this medicine suddenly. This could lead to serious heart-related effects. Talk to your pediatrician regarding the use of this medicine in children. While this drug may be prescribed for children as young as 6 years for selected conditions, precautions do apply. Overdosage: If you think you have taken too much of this medicine contact a poison control center or emergency room at once. NOTE: This medicine is only for you. Do not share this medicine with others. What if I miss a dose? If you miss a dose, take it as soon as you can. If it is almost time for your next dose, take only that dose. Do not take double or extra doses. What may interact with this medicine? This medicine may interact with the following medications:  certain medicines for blood pressure, heart disease, irregular heart beat  certain medicines for depression, like monoamine oxidase (MAO) inhibitors, fluoxetine, or paroxetine  clonidine  dobutamine  epinephrine  isoproterenol  reserpine This list may not describe all possible interactions. Give your health care provider a list of all the medicines, herbs, non-prescription drugs, or dietary supplements you use. Also tell them if you smoke, drink alcohol, or use illegal drugs. Some items may interact with your medicine. What should I watch for while using this medicine? Visit your doctor or health care professional for regular check ups. Contact your doctor right away  if your symptoms worsen. Check your blood pressure and pulse rate regularly. Ask your health care professional what  your blood pressure and pulse rate should be, and when you should contact them. You may get drowsy or dizzy. Do not drive, use machinery, or do anything that needs mental alertness until you know how this medicine affects you. Do not sit or stand up quickly, especially if you are an older patient. This reduces the risk of dizzy or fainting spells. Contact your doctor if these symptoms continue. Alcohol may interfere with the effect of this medicine. Avoid alcoholic drinks. This medicine may increase blood sugar. Ask your healthcare provider if changes in diet or medicines are needed if you have diabetes. What side effects may I notice from receiving this medicine? Side effects that you should report to your doctor or health care professional as soon as possible:  allergic reactions like skin rash, itching or hives  cold or numb hands or feet  depression  difficulty breathing  faint  fever with sore throat  irregular heartbeat, chest pain  rapid weight gain   signs and symptoms of high blood sugar such as being more thirsty or hungry or having to urinate more than normal. You may also feel very tired or have blurry vision.  swollen legs or ankles Side effects that usually do not require medical attention (report to your doctor or health care professional if they continue or are bothersome):  anxiety or nervousness  change in sex drive or performance  dry skin  headache  nightmares or trouble sleeping  short term memory loss  stomach upset or diarrhea This list may not describe all possible side effects. Call your doctor for medical advice about side effects. You may report side effects to FDA at 1-800-FDA-1088. Where should I keep my medicine? Keep out of the reach of children. Store at room temperature between 15 and 30 degrees C (59 and 86 degrees F). Throw away any unused medicine after the expiration date. NOTE: This sheet is a summary. It may not cover all possible  information. If you have questions about this medicine, talk to your doctor, pharmacist, or health care provider.  2020 Elsevier/Gold Standard (2018-04-24 11:09:41)      Adopting a Healthy Lifestyle.  Know what a healthy weight is for you (roughly BMI <25) and aim to maintain this   Aim for 7+ servings of fruits and vegetables daily   65-80+ fluid ounces of water or unsweet tea for healthy kidneys   Limit to max 1 drink of alcohol per day; avoid smoking/tobacco   Limit animal fats in diet for cholesterol and heart health - choose grass fed whenever available   Avoid highly processed foods, and foods high in saturated/trans fats   Aim for low stress - take time to unwind and care for your mental health   Aim for 150 min of moderate intensity exercise weekly for heart health, and weights twice weekly for bone health   Aim for 7-9 hours of sleep daily   When it comes to diets, agreement about the perfect plan isnt easy to find, even among the experts. Experts at the [place] developed an idea known as the Healthy Eating Plate. Just imagine a plate divided into logical, healthy portions.   The emphasis is on diet quality:   Load up on vegetables and fruits - one-half of your plate: Aim for color and variety, and remember that potatoes dont count.   Go  for whole grains - one-quarter of your plate: Whole wheat, barley, wheat berries, quinoa, oats, brown rice, and foods made with them. If you want pasta, go with whole wheat pasta.   Protein power - one-quarter of your plate: Fish, chicken, beans, and nuts are all healthy, versatile protein sources. Limit red meat.   The diet, however, does go beyond the plate, offering a few other suggestions.   Use healthy plant oils, such as olive, canola, soy, corn, sunflower and peanut. Check the labels, and avoid partially hydrogenated oil, which have unhealthy trans fats.   If youre thirsty, drink water. Coffee and  tea are good in moderation, but skip sugary drinks and limit milk and dairy products to one or two daily servings.   The type of carbohydrate in the diet is more important than the amount. Some sources of carbohydrates, such as vegetables, fruits, whole grains, and beans-are healthier than others.   Finally, stay active  Signed, Thomasene Ripple, DO  07/03/2019 3:44 PM    [facility]

## 2019-07-04 LAB — MAGNESIUM: Magnesium: 1.9 mg/dL (ref 1.6–2.3)

## 2019-07-04 LAB — BASIC METABOLIC PANEL
BUN/Creatinine Ratio: 9 (ref 9–23)
BUN: 8 mg/dL (ref 6–20)
CO2: 22 mmol/L (ref 20–29)
Calcium: 10.1 mg/dL (ref 8.7–10.2)
Chloride: 96 mmol/L (ref 96–106)
Creatinine, Ser: 0.94 mg/dL (ref 0.57–1.00)
GFR calc Af Amer: 93 mL/min/{1.73_m2} (ref >59)
GFR calc non Af Amer: 81 mL/min/{1.73_m2} (ref >59)
Glucose: 74 mg/dL (ref 65–99)
Potassium: 4.2 mmol/L (ref 3.5–5.2)
Sodium: 133 mmol/L — ABNORMAL LOW (ref 134–144)

## 2019-08-22 DIAGNOSIS — M545 Low back pain, unspecified: Secondary | ICD-10-CM | POA: Insufficient documentation

## 2019-08-22 DIAGNOSIS — G8929 Other chronic pain: Secondary | ICD-10-CM | POA: Insufficient documentation

## 2019-09-26 DIAGNOSIS — M5412 Radiculopathy, cervical region: Secondary | ICD-10-CM | POA: Insufficient documentation

## 2019-09-27 ENCOUNTER — Other Ambulatory Visit: Payer: Self-pay | Admitting: Gastroenterology

## 2019-10-01 ENCOUNTER — Telehealth (INDEPENDENT_AMBULATORY_CARE_PROVIDER_SITE_OTHER): Payer: Medicaid Other | Admitting: Cardiology

## 2019-10-01 ENCOUNTER — Other Ambulatory Visit: Payer: Self-pay

## 2019-10-01 ENCOUNTER — Encounter: Payer: Self-pay | Admitting: Cardiology

## 2019-10-01 DIAGNOSIS — R002 Palpitations: Secondary | ICD-10-CM | POA: Diagnosis not present

## 2019-10-01 DIAGNOSIS — G4733 Obstructive sleep apnea (adult) (pediatric): Secondary | ICD-10-CM

## 2019-10-01 DIAGNOSIS — I10 Essential (primary) hypertension: Secondary | ICD-10-CM | POA: Diagnosis not present

## 2019-10-01 DIAGNOSIS — Z720 Tobacco use: Secondary | ICD-10-CM

## 2019-10-01 DIAGNOSIS — Z9989 Dependence on other enabling machines and devices: Secondary | ICD-10-CM

## 2019-10-01 NOTE — Progress Notes (Signed)
Telephone visit ( Patient unable to get connection on her phone for a video visit). The patient on the phone confirmed by birthday verification- the patient consent to this visit.  Date:  10/01/2019   ID:  Amber [name], DOB [birth year], MRN [id number]  Patient at home.  Provider is in the office.   PCP:  Galvin Proffer, MD  Cardiologist:  Thomasene Ripple, DO  Electrophysiologist:  None   Evaluation Performed: Follow up for palpitations.  Chief Complaint:  I am still having palpitations  History of Present Illness:    The patient does not have symptoms concerning for COVID-19 infection- she requested this visit as she is unable to wear mask to come for her office visit.  Amber [name] a 34 y.o.femalewith a hypertension diagnosed 1 year ago, diabetes type 2, hyperlipidemia,raynaud'ssyndrome, anxiety disorder presents today to be evaluated for chest pain, and palpitations.    During her initial visit I was able to review her monitor report with her which was performed in July 2020 that showed that her heart rate went as high as 160 otherwise no evidence of PACs, PVCs or any atrial arrhythmias.  At the time I did educate the patient that her sinus tachycardia was secondary likely to her anxiety.  For the complaint shortness of breath echocardiogram was ordered and a CTA due to family history and her other medical conditions.  I saw the patient on 07/03/2019 at that visit we discussed her TTE and her CCTA results. At that time she also did tell me that she was taken of Lasix and was now taking Aldactone. She is still short of breat at times.    The patient tells me she is experiencing intermittent palpitations.  She notes that usually this is associated when she is anxious.  She denies any chest pain.  Past Medical History:  Diagnosis Date  . Acute pharyngitis, unspecified   . Anemia   . Anxiety   . Asthma 04/14/2016  . Chronic fatigue   . Clotting disorder (HCC)   . Colitis    . Cystitis, unspecified without hematuria   . Depression   . Dizziness 08/24/2017  . Dyspnea   . GERD (gastroesophageal reflux disease)   . Herpes simplex type 2 infection   . History of Clostridioides difficile colitis   . HPV (human papilloma virus) infection   . Hypertensive disorder 02/16/2018  . Hyperthyroidism   . Hypothyroidism 04/03/2019  . 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 Migraine headache   . Mild persistent asthma, uncomplicated   . Narcolepsy 2019  . Obstructive sleep apnea on CPAP 08/24/2017  . Palpitations   . Raynaud's disease without gangrene 04/03/2019  . Raynaud's syndrome without gangrene   . RMS[facility] spotted fever) 08/24/2017  . Sleep apnea   . Tick-borne relapsing fever   . Tobacco abuse 08/24/2017   Past Surgical History:  Procedure Laterality Date  . APPENDECTOMY    . COLONOSCOPY  04/14/2017   Dr Jennye Boroughs. Normal colonoscopy to the terminal ileum   . DILATION AND CURETTAGE OF UTERUS  2013  . DILATION AND EVACUATION  02/02/2012   Procedure: DILATATION AND EVACUATION;  Surgeon: Levi Aland, MD;  Location: [facility];  Service: Gynecology;  Laterality: N/A;  . ESOPHAGOGASTRODUODENOSCOPY  04/14/2017   Dr. Jennye Boroughs. Normal upper endoscopy. Empiric esophageal dilation to 50 [place]     Current Meds  Medication Sig  . AMBULATORY NON FORMULARY MEDICATION as directed. CPAP machine  . amitriptyline (ELAVIL) 100  MG tablet Take 100 mg by mouth at bedtime.  Marland Kitchen amLODipine (NORVASC) 5 MG tablet Take 5 mg by mouth daily.  . clobetasol ointment (TEMOVATE) 0.05 % APPLY TO AFFECTED AREA TWICE DAILY X2 3 WEEKS THEN AS NEEDED  . dicyclomine (BENTYL) 10 MG capsule Take 1 capsule by mouth daily.   . fluticasone (FLONASE) 50 MCG/ACT nasal spray Place 1 spray into both nostrils daily.   . Fluticasone-Umeclidin-Vilant (TRELEGY ELLIPTA) 100-62.5-25 MCG/INH AEPB Inhale 1 puff into the lungs daily.  . furosemide (LASIX) 20 MG tablet Take 1 tab 20 mg daily as  needed  . ipratropium-albuterol (DUONEB) 0.5-2.5 (3) MG/3ML SOLN Take 3 mLs by nebulization as directed.  Marland Kitchen levothyroxine (SYNTHROID) 100 MCG tablet 1 tablet daily.  Marland Kitchen losartan (COZAAR) 25 MG tablet Take 25 mg by mouth daily.  . Magnesium Hydroxide (MILK OF MAGNESIA PO) Take by mouth daily. 4 tablespoons daily  . methylphenidate (RITALIN) 20 MG tablet 1 tablet 3 (three) times daily.  . metoprolol succinate (TOPROL XL) 25 MG 24 hr tablet Take 0.5 tablets (12.5 mg total) by mouth daily.  . montelukast (SINGULAIR) 10 MG tablet Take 10 mg by mouth daily.  . ondansetron (ZOFRAN-ODT) 8 MG disintegrating tablet Take 1 tablet by mouth daily.   . pantoprazole (PROTONIX) 40 MG tablet TAKE 1 TABLET BY MOUTH EVERY DAY  . pregabalin (LYRICA) 150 MG capsule Take 150 mg by mouth 2 (two) times daily.  Marland Kitchen PROAIR HFA 108 (90 Base) MCG/ACT inhaler Inhale 2 puffs into the lungs every 4 (four) hours as needed.   Marland Kitchen spironolactone (ALDACTONE) 100 MG tablet Take 100 mg by mouth daily.  Marland Kitchen topiramate (TOPAMAX) 25 MG tablet Take 50 mg by mouth 2 (two) times daily.   . traMADol (ULTRAM) 50 MG tablet Take 50 mg by mouth as needed.  . valACYclovir (VALTREX) 500 MG tablet 1 tablet daily as needed.  . [DISCONTINUED] hydrOXYzine (ATARAX/VISTARIL) 25 MG tablet Take 25 mg by mouth daily.  . [DISCONTINUED] methylPREDNISolone (MEDROL) 4 MG tablet Take 4 mg by mouth daily.     Allergies:   Amoxicillin-pot clavulanate and Bactrim [sulfamethoxazole-trimethoprim]   Social History   Tobacco Use  . Smoking status: Current Every Day Smoker    Packs/day: 1.00    Years: 16.00    Pack years: 16.00    Types: Cigarettes  . Smokeless tobacco: Never Used  Substance Use Topics  . Alcohol use: No  . Drug use: Yes    Types: Marijuana     Family Hx: The patient's family history includes Breast cancer in her paternal grandmother; Cancer in her maternal aunt and maternal grandmother; Heart disease in her father; Hypertension in her  maternal grandmother; Vision loss in her maternal grandmother.  ROS:   Review of Systems  Constitution: Negative for decreased appetite, fever and weight gain.  HENT: Negative for congestion, ear discharge, hoarse voice and sore throat.   Eyes: Negative for discharge, redness, vision loss in right eye and visual halos.  Cardiovascular: Reports palpitations.  Negative for chest pain, dyspnea on exertion, leg swelling, orthopnea. Respiratory: Negative for cough, hemoptysis, shortness of breath and snoring.   Endocrine: Negative for heat intolerance and polyphagia.  Hematologic/Lymphatic: Negative for bleeding problem. Does not bruise/bleed easily.  Skin: Negative for flushing, nail changes, rash and suspicious lesions.  Musculoskeletal: Negative for arthritis, joint pain, muscle cramps, myalgias, neck pain and stiffness.  Gastrointestinal: Negative for abdominal pain, bowel incontinence, diarrhea and excessive appetite.  Genitourinary: Negative for decreased libido, genital  sores and incomplete emptying.  Neurological: Negative for brief paralysis, focal weakness, headaches and loss of balance.  Psychiatric/Behavioral: Negative for altered mental status, depression and suicidal ideas.  Allergic/Immunologic: Negative for HIV exposure and persistent infections.    Prior CV studies:   The following studies were reviewed today:  Transthoracic echocardiogram IMPRESSIONS 04/04/19 1. Left ventricular ejection fraction, by visual estimation, is 60 to 65%. The left ventricle has normal function. Normal left ventricular size. There is no left ventricular hypertrophy. 2. Global right ventricle has normal systolic function.The right ventricular size is normal. No increase in right ventricular wall thickness. 3. Left atrial size was normal. 4. Right atrial size was normal. 5. The mitral valve is normal in structure. Trace mitral valve regurgitation. No evidence of mitral stenosis. 6. The tricuspid  valve is normal in structure. Tricuspid valve regurgitation was not visualized by color flow Doppler. 7. The aortic valve is normal in structure. Aortic valve regurgitation was not visualized by color flow Doppler. Structurally normal aortic valve, with no evidence of sclerosis or stenosis. 8. The pulmonic valve was normal in structure. Pulmonic valve regurgitation is not visualized by color flow Doppler. 9. The inferior vena cava is normal in size with greater than 50% respiratory variability, suggesting right atrial pressure of 3 mmHg.  Coronary CTA FINDINGS:05/31/2019 Coronary calcium score: The patient's coronary artery calcium score is 0, which places the patient in the 0 percentile.  Coronary arteries: Normal coronary origins. Right dominance.  Right Coronary Artery: Normal caliber vessel, gives rise to PDA. No significant plaque or stenosis.  Left Main Coronary Artery: Very short trunk, common ostium but then almost immediately bifurcates. No significant plaque or stenosis.  Left Anterior Descending Coronary Artery: Normal caliber vessel. No significant plaque or stenosis. Gives rise to 1 medium diagonal branch.  Left Circumflex Artery: Normal caliber vessel. No significant plaque or stenosis. Gives rise to 1 large OM branches.  Aorta: Normal size, 26 mm at the mid ascending aorta (level of the PA bifurcation) measured double oblique. No calcifications. No dissection.  Aortic Valve: No calcifications. Trileaflet.  Other findings:  Normal pulmonary vein drainage into the left atrium.  Normal left atrial appendage without a thrombus.  Normal size of the pulmonary artery.  IMPRESSION: 1. No evidence of CAD, CADRADS = 0.  2. Coronary calcium score of 0. This was 0 percentile for age and sex matched control.  3. Normal coronary origin with right dominance.  4. Overall small caliber vessels without any plaque or stenosis.  Labs/Other Tests and Data  Reviewed:    EKG: None today  Recent Labs: 12/06/2018: ALT 20 07/03/2019: BUN 8; Creatinine, Ser 0.94; Magnesium 1.9; Potassium 4.2; Sodium 133   Recent Lipid Panel No results found for: CHOL, TRIG, HDL, CHOLHDL, LDLCALC, LDLDIRECT  Wt Readings from Last 3 Encounters:  07/03/19 160 lb 1.6 oz (72.6 kg)  04/03/19 146 lb (66.2 kg)  01/04/19 135 lb (61.2 kg)     Objective:    Vital Signs:  Patient unable to do her vitals at home.  Telephone visit - physical exam not performed.  ASSESSMENT & PLAN:    1. Palpitations-I discussed with patient as we have reviewed her monitor from July 2020 in previous visits.  She has had intermittent palpitations but I do believe that her anxiety is playing a role significantly.  She still has not had any follow-up with her primary care doctor for discussing treatment for her anxiety.  At this time as well difficult for me  to be able to increase her beta-blocker-this is because I strongly believe that her anxiety needs to be treated which will help with her palpitations and secondly there are no vitals for the patient at this visit or in the recent future to make a good decision without causing significant hypotension.  2. She will be continued on her current antihypertensive regimen.  3. Unfortunately patient tells me she is on Lipitor tolerating mass therefore she would not be able to come in the office for any blood pressure checks while inpatient visit for now.  4. Tobacco use-smoking cessation advised.  COVID-19 Education: The signs and symptoms of COVID-19 were discussed with the patient and how to seek care for testing (follow up with PCP or arrange E-visit).  he importance of social distancing was discussed today.  Time:   Today, I have spent 10 minutes with the patient with telehealth technology discussing the above problems.     Medication Adjustments/Labs and Tests Ordered: Current medicines are reviewed at length with the patient today.   Concerns regarding medicines are outlined above.   Tests Ordered: No orders of the defined types were placed in this encounter.   Medication Changes: No orders of the defined types were placed in this encounter.   Follow Up:  Follow up in 6 months.  Osvaldo Shipper, DO  10/01/2019 9:27 PM     [facility]

## 2019-10-01 NOTE — Patient Instructions (Signed)

## 2019-10-02 DIAGNOSIS — G43019 Migraine without aura, intractable, without status migrainosus: Secondary | ICD-10-CM | POA: Insufficient documentation

## 2019-10-04 DIAGNOSIS — R413 Other amnesia: Secondary | ICD-10-CM | POA: Insufficient documentation

## 2019-11-12 ENCOUNTER — Encounter: Payer: Self-pay | Admitting: Gastroenterology

## 2019-11-12 ENCOUNTER — Other Ambulatory Visit: Payer: Self-pay

## 2019-11-12 ENCOUNTER — Telehealth (INDEPENDENT_AMBULATORY_CARE_PROVIDER_SITE_OTHER): Payer: Medicaid Other | Admitting: Gastroenterology

## 2019-11-12 VITALS — Ht 61.0 in | Wt 160.0 lb

## 2019-11-12 DIAGNOSIS — K581 Irritable bowel syndrome with constipation: Secondary | ICD-10-CM | POA: Diagnosis not present

## 2019-11-12 DIAGNOSIS — K219 Gastro-esophageal reflux disease without esophagitis: Secondary | ICD-10-CM | POA: Diagnosis not present

## 2019-11-12 DIAGNOSIS — R1084 Generalized abdominal pain: Secondary | ICD-10-CM | POA: Diagnosis not present

## 2019-11-12 DIAGNOSIS — F17210 Nicotine dependence, cigarettes, uncomplicated: Secondary | ICD-10-CM | POA: Diagnosis not present

## 2019-11-12 MED ORDER — HYOSCYAMINE SULFATE 0.125 MG SL SUBL: SUBLINGUAL_TABLET | SUBLINGUAL | 0 refills | Status: DC

## 2019-11-12 MED ORDER — PANTOPRAZOLE SODIUM 40 MG PO TBEC: 40.0000 mg | DELAYED_RELEASE_TABLET | Freq: Every day | ORAL | 6 refills | Status: DC

## 2019-11-12 NOTE — Patient Instructions (Signed)
If you are age 33 or older, your body mass index should be between 23-30. Your Body mass index is 30.23 kg/m. If this is out of the aforementioned range listed, please consider follow up with your Primary Care Provider.  If you are age 71 or younger, your body mass index should be between 19-25. Your Body mass index is 30.23 kg/m. If this is out of the aformentioned range listed, please consider follow up with your Primary Care Provider.   We have sent the following medications to your pharmacy for you to pick up at your convenience: Protonix  Levsin   Follow up in 12 weeks.   Thank you,  Dr. Lynann Bologna

## 2019-11-12 NOTE — Progress Notes (Signed)
Chief Complaint: Abdominal pain/constipation/rectal bleeding/increased WBC count  Referring Provider:  Dr Ardelle Park      ASSESSMENT AND PLAN;   #1.  Generalized abdominal pain with H/O rectal bleeding.  Abnormal CT with contrast 11/12/2018 showing questionable enterocolitis. (Rx with metro/cipro). Rpt CT 11/19/2018- neg. Neg EGD 04/14/2017, neg colon to TI 08/2018. Neg CT AP  10/15/2019 at [facility]  #2.  IBS with constipation. Nl CBC, CMP, lipase 11/12/2018. Remote H/O C.Diff. Neg colon 08/2018. Failed linzess, benefiber, metamucil and colace.  #3. GERD.  #4. Co-morbid conditions include hypothyroidism (last TSH was Nl), chronic fatigue, IC, tremor, narcolepsy, asthma, fibromyalgia and sleep apnea.  #4.  Marijuana/tobacco abuse.  Plan: - Continue protonix 40mg  po qd (#30, 6 refills). - Continue milk of magnesia.  If she still has problems, will give her a trial of trulance/montergrity. - Trial of levsin 0.125 mg Q4-6 hrs #120. - She will also discuss all medications including Norvasc with Dr. .  I believe she is taking too many medications. - Call in 2 weeks. - FU in 12 weeks.    HPI:    Amber [name] is a 34 y.o. female (Amber [name])  C/O Left sisded abdo pain with nausea, no vomiting. Seen in ED at [facility] - Dx with UTI -treated with Keflex x 7 days.  Underwent CT scan Abdo/pelvis with p.o. and IV contrast which was unremarkable.  She had normal pancreas.  CBC, CMP, lipase were normal.  For follow-up visit.  She wanted a telephone visit.  Overall has been doing somewhat better.  Continues to have constipation.  Could not tolerate MiraLAX and multiple other medications as listed above including Linzess.  She is better with MOM.  No fever or chiils  Had had EGD and colonoscopy which were negative as above.    From previous note: -CT scan on 11/12/2018- ?enterocolitis.  Treated with metronidazole and Cipro.  Repeat CT scan was unremarkable.  -Longstanding constipation-would have frequency  of 1-2 bowel movement/week.  Despite MiraLAX/Benefiber.  Most recently she has been taking milk of magnesia every day and would have some bowel movement.  She will occasionally have diarrhea especially after taking more MOM. -Continues to use marijuana -Has sleeping disorders- narcolepsy/sleep apnea, on ritalin/CPAP.  Past Medical History:  Diagnosis Date  . Acute pharyngitis, unspecified   . Anemia   . Anxiety   . Asthma 04/14/2016  . Chronic fatigue   . Clotting disorder (HCC)   . Colitis   . Cystitis, unspecified without hematuria   . Depression   . Dizziness 08/24/2017  . Dyspnea   . GERD (gastroesophageal reflux disease)   . Herpes simplex type 2 infection   . History of Clostridioides difficile colitis   . HPV (human papilloma virus) infection   . Hypertensive disorder 02/16/2018  . Hyperthyroidism   . Hypothyroidism 04/03/2019  . 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 Migraine headache   . Mild persistent asthma, uncomplicated   . Narcolepsy 2019  . Obstructive sleep apnea on CPAP 08/24/2017  . Palpitations   . Raynaud's disease without gangrene 04/03/2019  . Raynaud's syndrome without gangrene   . RMSF [facility] spotted fever) 08/24/2017  . Sleep apnea   . Tick-borne relapsing fever   . Tobacco abuse 08/24/2017    Past Surgical History:  Procedure Laterality Date  . APPENDECTOMY    . COLONOSCOPY  04/14/2017   Dr 04/16/2017. Normal colonoscopy to the terminal ileum   . DILATION AND CURETTAGE OF UTERUS  2013  .  DILATION AND EVACUATION  02/02/2012   Procedure: DILATATION AND EVACUATION;  Surgeon: Olga Millers, MD;  Location: [place] [facility];  Service: Gynecology;  Laterality: N/A;  . ESOPHAGOGASTRODUODENOSCOPY  04/14/2017   Dr. Lyda Jester. Normal upper endoscopy. Empiric esophageal dilation to 50 [place]    Family History  Problem Relation Age of Onset  . Cancer Maternal Aunt   . Hypertension Maternal Grandmother   . Vision loss Maternal Grandmother   . Cancer  Maternal Grandmother   . Breast cancer Paternal Grandmother   . Heart disease Father     Social History   Tobacco Use  . Smoking status: Current Every Day Smoker    Packs/day: 1.00    Years: 16.00    Pack years: 16.00    Types: Cigarettes  . Smokeless tobacco: Never Used  Substance Use Topics  . Alcohol use: No  . Drug use: Yes    Types: Marijuana    Current Outpatient Medications  Medication Sig Dispense Refill  . AMBULATORY NON FORMULARY MEDICATION as directed. CPAP machine    . amitriptyline (ELAVIL) 100 MG tablet Take 100 mg by mouth at bedtime.    Marland Kitchen amLODipine (NORVASC) 5 MG tablet Take 5 mg by mouth daily.    . Fluticasone-Umeclidin-Vilant (TRELEGY ELLIPTA) 100-62.5-25 MCG/INH AEPB Inhale 1 puff into the lungs daily.    Marland Kitchen ipratropium-albuterol (DUONEB) 0.5-2.5 (3) MG/3ML SOLN Take 3 mLs by nebulization as directed.    Marland Kitchen levothyroxine (SYNTHROID) 100 MCG tablet 1 tablet daily.    Marland Kitchen losartan (COZAAR) 25 MG tablet Take 25 mg by mouth daily.    . Magnesium Hydroxide (MILK OF MAGNESIA PO) Take by mouth daily. 4 tablespoons daily    . methylphenidate (RITALIN) 20 MG tablet 1 tablet 3 (three) times daily.    . metoprolol succinate (TOPROL XL) 25 MG 24 hr tablet Take 0.5 tablets (12.5 mg total) by mouth daily. 45 tablet 1  . montelukast (SINGULAIR) 10 MG tablet Take 10 mg by mouth daily.    . ondansetron (ZOFRAN-ODT) 8 MG disintegrating tablet Take 1 tablet by mouth daily.     . pantoprazole (PROTONIX) 40 MG tablet TAKE 1 TABLET BY MOUTH EVERY DAY 30 tablet 6  . pregabalin (LYRICA) 150 MG capsule Take 150 mg by mouth 2 (two) times daily.    Marland Kitchen PROAIR HFA 108 (90 Base) MCG/ACT inhaler Inhale 2 puffs into the lungs every 4 (four) hours as needed.     Marland Kitchen spironolactone (ALDACTONE) 100 MG tablet Take 100 mg by mouth daily.    Marland Kitchen topiramate (TOPAMAX) 25 MG tablet Take 50 mg by mouth 2 (two) times daily.     . traMADol (ULTRAM) 50 MG tablet Take 50 mg by mouth daily.     . valACYclovir  (VALTREX) 500 MG tablet 1 tablet daily as needed.    . dicyclomine (BENTYL) 10 MG capsule Take 1 capsule by mouth daily.     . fluticasone (FLONASE) 50 MCG/ACT nasal spray Place 1 spray into both nostrils daily.     . furosemide (LASIX) 20 MG tablet Take 1 tab 20 mg daily as needed (Patient not taking: Reported on 11/12/2019) 60 tablet 1   No current facility-administered medications for this visit.    Allergies  Allergen Reactions  . Amoxicillin-Pot Clavulanate Nausea And Vomiting  . Bactrim [Sulfamethoxazole-Trimethoprim] Nausea And Vomiting    Review of Systems:  Constitutional: Denies fever, chills, diaphoresis, appetite change and has fatigue.  HEENT: Does have deviated nasal septum, chronic sinusitis Respiratory: Denies  SOB, DOE, cough, chest tightness,  And has occasional wheezing.   Cardiovascular: Denies chest pain, palpitations and leg swelling.  Genitourinary: Denies dysuria, has nocturia with negative UA.  Musculoskeletal: Has  myalgias, back pain, No joint swelling, arthralgias and gait problem.  Skin: No rash.  Neurological: Denies dizziness, seizures, syncope, weakness, light-headedness, numbness and headaches.  Hematological: Denies adenopathy. Easy bruising, personal or family bleeding history  Psychiatric/Behavioral: Has anxiety, sleep problems    Physical Exam:    Ht 5\' 1"  (1.549 m)   Wt 160 lb (72.6 kg)   BMI 30.23 kg/m  Filed Weights   11/12/19 1351  Weight: 160 lb (72.6 kg)  Not examined since it was a tele-visit   Data Reviewed: I have personally reviewed following labs and imaging studies  CBC: CBC Latest Ref Rng & Units 07/22/2014 02/27/2014 02/02/2012  WBC 4.0 - 10.5 K/uL 10.8(H) 8.1 14.1(H)  Hemoglobin 12.0 - 15.0 g/dL 02/04/2012 05.3 97.6  Hematocrit 36.0 - 46.0 % 41.0 42.1 40.9  Platelets 150 - 400 K/uL 232 216 273    CMP: CMP Latest Ref Rng & Units 07/03/2019 05/06/2019 12/06/2018  Glucose 65 - 99 mg/dL 74 12/08/2018) 79  BUN 6 - 20 mg/dL 8 12 8    Creatinine 0.57 - 1.00 mg/dL 193(X 9.02  Sodium 134 - 144 mmol/L 133(L) 135 137  Potassium 3.5 - 5.2 mmol/L 4.2 3.8 4.2  Chloride 96 - 106 mmol/L 96 97 101  CO2 20 - 29 mmol/L 22 22 27   Calcium 8.7 - 10.2 mg/dL 4.09 9.3 9.7  Total Protein 6.0 - 8.3 g/dL - - 7.3  Total Bilirubin 0.2 - 1.2 mg/dL - - 0.6  Alkaline Phos 39 - 117 U/L - - 64  AST 0 - 37 U/L - - 23  ALT 0 - 35 U/L - - 20   This service was provided via telemedicine at patient's request using telephone (no Internet connection).  The patient was located at home.  The provider was located in office.  The patient did consent to this telephone visit and is aware of possible charges through their insurance for this visit.  The patient was referred by Dr. 7.35.   Time spent on call/coordination of care/review of extensive records: 25 min     , MD 11/12/2019, 4:17 PM  Cc: Dr Ardelle Park

## 2019-11-12 NOTE — Addendum Note (Signed)
Addended by: Junius Roads on: 11/12/2019 04:54 PM   Modules accepted: Orders

## 2019-12-03 DIAGNOSIS — M79641 Pain in right hand: Secondary | ICD-10-CM | POA: Insufficient documentation

## 2019-12-03 DIAGNOSIS — R202 Paresthesia of skin: Secondary | ICD-10-CM | POA: Insufficient documentation

## 2020-01-09 DIAGNOSIS — J312 Chronic pharyngitis: Secondary | ICD-10-CM | POA: Insufficient documentation

## 2020-01-22 ENCOUNTER — Telehealth: Payer: Self-pay

## 2020-01-22 NOTE — Telephone Encounter (Signed)
Patient states that she has been having upper abdominal pain over the past 2-3 weeks and dysphagia.  She went to her ENT who increased Protonix to 40 mg twice daily 2 weeks ago. This has helped some. She is taking all her medication as prescribed,and continues to smoke marijuana daily. Patient would like to know if any further testing, procedures, or medication would benefit  Her.

## 2020-01-22 NOTE — Telephone Encounter (Signed)
Lmom for patient to call back 

## 2020-01-27 NOTE — Telephone Encounter (Signed)
I have reviewed her previous notes She did not have dysphagia previously This seems to be a new problem  Plan: -Continue Protonix 40 mg p.o. twice daily -Proceed with barium swallow with barium tablet -Follow-up thereafter  RG

## 2020-01-28 NOTE — Telephone Encounter (Signed)
Checking back on status of her message, she had not heard back.

## 2020-01-29 ENCOUNTER — Other Ambulatory Visit: Payer: Self-pay | Admitting: Gastroenterology

## 2020-01-29 DIAGNOSIS — K581 Irritable bowel syndrome with constipation: Secondary | ICD-10-CM

## 2020-01-29 DIAGNOSIS — R1084 Generalized abdominal pain: Secondary | ICD-10-CM

## 2020-01-29 DIAGNOSIS — K219 Gastro-esophageal reflux disease without esophagitis: Secondary | ICD-10-CM

## 2020-01-29 NOTE — Telephone Encounter (Signed)
Spoke to patient to inform her of Dr Urban Gibson recommendations. She will continue taking Protonix 40 mg twice daily. Orders faxed to [facility] for Barium Swallow with tablet. They will contact her with a date and time. Patient requested an appointment for a new problem with fecal incontinence. Scheduled for next available. All questions answered patient voiced understanding.

## 2020-01-29 NOTE — Telephone Encounter (Signed)
LMOM for patient to call back.

## 2020-02-07 ENCOUNTER — Encounter: Payer: Self-pay | Admitting: Gastroenterology

## 2020-02-11 DIAGNOSIS — J449 Chronic obstructive pulmonary disease, unspecified: Secondary | ICD-10-CM | POA: Insufficient documentation

## 2020-02-18 ENCOUNTER — Ambulatory Visit: Payer: Medicaid Other | Admitting: Gastroenterology

## 2020-02-25 ENCOUNTER — Telehealth: Payer: Self-pay

## 2020-02-25 NOTE — Telephone Encounter (Signed)
Left message on patients voicemail regarding normal barium swallow study.  Per Dr Chales Abrahams, plan as per last note.  Patient is return my call if she has further questions.  Patient is scheduled for an office visit on 03/19/20.

## 2020-03-19 ENCOUNTER — Telehealth (INDEPENDENT_AMBULATORY_CARE_PROVIDER_SITE_OTHER): Payer: Medicaid Other | Admitting: Gastroenterology

## 2020-03-19 ENCOUNTER — Encounter: Payer: Self-pay | Admitting: Gastroenterology

## 2020-03-19 VITALS — Ht 61.0 in | Wt 168.0 lb

## 2020-03-19 DIAGNOSIS — K625 Hemorrhage of anus and rectum: Secondary | ICD-10-CM

## 2020-03-19 DIAGNOSIS — R1084 Generalized abdominal pain: Secondary | ICD-10-CM | POA: Diagnosis not present

## 2020-03-19 NOTE — Progress Notes (Signed)
Chief Complaint: FU  Referring Provider:  Dr Ardelle Park      ASSESSMENT AND PLAN;   #1.  Generalized abdominal pain with chronic constipation with H/O rectal bleeding, likely d/t hemorrhoids. Nl CBC, CT AP 10/15/2019, neg colon to TI 08/2018.   #2.  IBS-C. Nl CBC, CMP, lipase 11/12/2018. Remote H/O C.Diff. Neg colon 08/2018. Failed linzess, benefiber, metamucil and colace.  #3. GERD.  ENT eval with LPR. Neg barium swallow 01/2020  #4. Co-morbid conditions include hypothyroidism (last TSH was Nl), chronic fatigue, IC, tremor, narcolepsy, asthma, fibromyalgia and sleep apnea.  #4.  Marijuana/tobacco abuse.  Plan: - Continue protonix 40mg  po BID as suggested by ENT (#30, 6 refills). - Continue milk of magnesia.  If she still has problems, will give her a trial of trulance/montergrity. - Prep H 1 bid after BMs x 10 days. - Stop smoking - FU as needed.  Earlier, if still with problems.    HPI:    Amber LIGHTSEY is a 34 y.o. female (Amber [name]) FU Wanted televisit d/t transportation problems  Seen in ED on 10/15/2019 d/t generalized abdominal pain. Dx with UTI -treated with Keflex x 7 days.  Underwent CT scan Abdo/pelvis with p.o. and IV contrast which was unremarkable.  She had normal pancreas.  CBC, CMP, lipase were normal.  Overall has been doing somewhat better.  Continues to have constipation.  Could not tolerate MiraLAX and multiple other medications as listed above including Linzess.  She is better with MOM.  No fever or chiils  Had had EGD and colonoscopy which were negative as above.  Previously had been having sharp neck pains with questionable dysphagia.  Had barium swallow on 02/07/2020 which was unremarkable.  She was seen by ENT and was diagnosed as having LPR.  She had negative nasopharyngoscopy otherwise.  Protonix was increased to 40 mg p.o. twice daily with good results.  He continues to smoke and use marijuana despite of medical advice.    From previous note: -CT  Abdo[facility]pelvis 10/15/2019 at [facility]: No acute abnormalities. -Barium swallow 02/07/2020 unremarkable.  Barium tablet passed without any problems. -CT scan on 11/12/2018- ?enterocolitis.  Treated with metronidazole and Cipro.  Repeat CT scan was unremarkable.  -Continues to use marijuana -Has sleeping disorders- narcolepsy/sleep apnea, on ritalin/CPAP.  Past Medical History:  Diagnosis Date  . Acute pharyngitis, unspecified   . Allergic rhinitis   . Anemia   . Anxiety   . Arthritis   . Asthma 04/14/2016  . Chronic fatigue   . Chronic nausea   . Clotting disorder (HCC)   . Colitis   . COPD (chronic obstructive pulmonary disease) (HCC)   . Cystitis, unspecified without hematuria   . Depression   . Dizziness 08/24/2017  . Dyspnea   . Fibromyalgia   . Fluid retention   . GERD (gastroesophageal reflux disease)   . Herpes simplex type 2 infection   . History of Clostridioides difficile colitis   . HPV (human papilloma virus) infection   . Hypertensive disorder 02/16/2018  . Hyperthyroidism   . Hypothyroidism 04/03/2019  . IBS (irritable bowel syndrome)   . 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 Migraine headache   . Mild persistent asthma, uncomplicated   . Narcolepsy 2019  . Obstructive sleep apnea on CPAP 08/24/2017  . Palpitations   . Raynaud's disease without gangrene 04/03/2019  . Raynaud's syndrome without gangrene   . RMSF [facility] spotted fever) 08/24/2017  . Sleep apnea   . Tachycardia   .  Tick-borne relapsing fever   . Tobacco abuse 08/24/2017  . Tremor   . Trigger thumb    and inflammation    Past Surgical History:  Procedure Laterality Date  . APPENDECTOMY    . COLONOSCOPY  04/14/2017   Dr Jennye Boroughs. Normal colonoscopy to the terminal ileum   . DILATION AND CURETTAGE OF UTERUS  2013  . DILATION AND EVACUATION  02/02/2012   Procedure: DILATATION AND EVACUATION;  Surgeon: Levi Aland, MD;  Location: [facility];  Service: Gynecology;  Laterality: N/A;  .  ESOPHAGOGASTRODUODENOSCOPY  04/14/2017   Dr. Jennye Boroughs. Normal upper endoscopy. Empiric esophageal dilation to 50 [place]    Family History  Problem Relation Age of Onset  . Cancer Maternal Aunt   . Hypertension Maternal Grandmother   . Vision loss Maternal Grandmother   . Cancer Maternal Grandmother   . Breast cancer Paternal Grandmother   . Heart disease Father     Social History   Tobacco Use  . Smoking status: Current Every Day Smoker    Packs/day: 1.00    Years: 16.00    Pack years: 16.00    Types: Cigarettes  . Smokeless tobacco: Never Used  Vaping Use  . Vaping Use: Former  Substance Use Topics  . Alcohol use: No  . Drug use: Yes    Types: Marijuana    Current Outpatient Medications  Medication Sig Dispense Refill  . albuterol (VENTOLIN HFA) 108 (90 Base) MCG/ACT inhaler Inhale 1 puff into the lungs daily. Can do up to 4 times daily    . AMBULATORY NON FORMULARY MEDICATION as directed. CPAP machine    . amitriptyline (ELAVIL) 100 MG tablet Take 100 mg by mouth at bedtime.    Marland Kitchen amLODipine (NORVASC) 5 MG tablet Take 5 mg by mouth daily.    . Fluticasone-Umeclidin-Vilant (TRELEGY ELLIPTA) 100-62.5-25 MCG/INH AEPB Inhale 1 puff into the lungs daily.    . hyoscyamine (LEVSIN SL) 0.125 MG SL tablet Take one tablet every 4-6 hours as needed. 120 tablet 0  . ipratropium-albuterol (DUONEB) 0.5-2.5 (3) MG/3ML SOLN Take 3 mLs by nebulization as directed.    Marland Kitchen levothyroxine (SYNTHROID) 100 MCG tablet 1 tablet daily.    . Magnesium Hydroxide (MILK OF MAGNESIA PO) Take by mouth daily. 4 tablespoons daily    . meloxicam (MOBIC) 7.5 MG tablet Take 7.5 mg by mouth daily.    . methylphenidate (RITALIN) 20 MG tablet 1 tablet 3 (three) times daily.    . metoprolol succinate (TOPROL XL) 25 MG 24 hr tablet Take 0.5 tablets (12.5 mg total) by mouth daily. 45 tablet 1  . montelukast (SINGULAIR) 10 MG tablet Take 10 mg by mouth daily.    . ondansetron (ZOFRAN-ODT) 8 MG disintegrating  tablet Take 1 tablet by mouth daily.     . pantoprazole (PROTONIX) 40 MG tablet Take 1 tablet (40 mg total) by mouth daily. (Patient taking differently: Take 40 mg by mouth 2 (two) times daily. ) 30 tablet 6  . pregabalin (LYRICA) 150 MG capsule Take 150 mg by mouth 2 (two) times daily.    Marland Kitchen PROAIR HFA 108 (90 Base) MCG/ACT inhaler Inhale 2 puffs into the lungs every 4 (four) hours as needed.     Marland Kitchen spironolactone (ALDACTONE) 100 MG tablet Take 100 mg by mouth daily.    Marland Kitchen topiramate (TOPAMAX) 50 MG tablet Take 50 mg by mouth 2 (two) times daily.    . traMADol (ULTRAM) 50 MG tablet Take 50 mg by mouth daily.     Marland Kitchen  valACYclovir (VALTREX) 500 MG tablet 1 tablet daily.     Marland Kitchen venlafaxine XR (EFFEXOR-XR) 150 MG 24 hr capsule Take 150 mg by mouth daily.     No current facility-administered medications for this visit.    Allergies  Allergen Reactions  . Sulfa Antibiotics Nausea And Vomiting and Other (See Comments)  . Amoxicillin-Pot Clavulanate Nausea And Vomiting  . Bactrim [Sulfamethoxazole-Trimethoprim] Nausea And Vomiting    Review of Systems:  Neg except HPI    Physical Exam:    Ht 5\' 1"  (1.549 m)   Wt 168 lb (76.2 kg)   BMI 31.74 kg/m  Filed Weights   03/19/20 0858  Weight: 168 lb (76.2 kg)  Not examined since it was a tele-visit   Data Reviewed: I have personally reviewed following labs and imaging studies  CBC: CBC Latest Ref Rng & Units 07/22/2014 02/27/2014 02/02/2012  WBC 4.0 - 10.5 K/uL 10.8(H) 8.1 14.1(H)  Hemoglobin 12.0 - 15.0 g/dL 02/04/2012 67.8 93.8  Hematocrit 36 - 46 % 41.0 42.1 40.9  Platelets 150 - 400 K/uL 232 216 273    CMP: CMP Latest Ref Rng & Units 07/03/2019 05/06/2019 12/06/2018  Glucose 65 - 99 mg/dL 74 12/08/2018) 79  BUN 6 - 20 mg/dL 8 12 8   Creatinine 0.57 - 1.00 mg/dL 751(W 2.58  Sodium 134 - 144 mmol/L 133(L) 135 137  Potassium 3.5 - 5.2 mmol/L 4.2 3.8 4.2  Chloride 96 - 106 mmol/L 96 97 101  CO2 20 - 29 mmol/L 22 22 27   Calcium 8.7 - 10.2 mg/dL  5.27 9.3 9.7  Total Protein 6.0 - 8.3 g/dL - - 7.3  Total Bilirubin 0.2 - 1.2 mg/dL - - 0.6  Alkaline Phos 39 - 117 U/L - - 64  AST 0 - 37 U/L - - 23  ALT 0 - 35 U/L - - 20   This service was provided via telemedicine at patient's request using telephone (no Internet connection).  The patient was located at home.  The provider was located in office.  The patient did consent to this telephone visit and is aware of possible charges through their insurance for this visit.  The patient was referred by Dr. 7.82.   Time spent on call/coordination of care/review of extensive records: 25 min     , MD 03/19/2020, 11:46 AM  Cc: Dr Ardelle Park

## 2020-03-19 NOTE — Patient Instructions (Signed)
If you are age 33 or older, your body mass index should be between 23-30. Your Body mass index is 31.74 kg/m. If this is out of the aforementioned range listed, please consider follow up with your Primary Care Provider.  If you are age 51 or younger, your body mass index should be between 19-25. Your Body mass index is 31.74 kg/m. If this is out of the aformentioned range listed, please consider follow up with your Primary Care Provider.   Please purchase the following medications over the counter and take as directed: Preparation H twice daily after bowel movements x 10 days.   Thank you,  Dr. Lynann Bologna

## 2020-03-27 ENCOUNTER — Other Ambulatory Visit: Payer: Self-pay | Admitting: Gastroenterology

## 2020-04-06 DIAGNOSIS — Z20822 Contact with and (suspected) exposure to covid-19: Secondary | ICD-10-CM | POA: Insufficient documentation

## 2020-04-06 DIAGNOSIS — J019 Acute sinusitis, unspecified: Secondary | ICD-10-CM | POA: Insufficient documentation

## 2020-04-06 DIAGNOSIS — B958 Unspecified staphylococcus as the cause of diseases classified elsewhere: Secondary | ICD-10-CM | POA: Insufficient documentation

## 2020-05-04 ENCOUNTER — Telehealth: Payer: Self-pay | Admitting: Gastroenterology

## 2020-05-04 NOTE — Telephone Encounter (Signed)
Spoke to patient who reports having fecal incontinence about once a week. She notices this more when she takes dulcolax. Patient is requesting an appointment. Appointment scheduled for next available with Dr Chales Abrahams

## 2020-06-16 DIAGNOSIS — I7300 Raynaud's syndrome without gangrene: Secondary | ICD-10-CM | POA: Insufficient documentation

## 2020-06-24 ENCOUNTER — Telehealth (INDEPENDENT_AMBULATORY_CARE_PROVIDER_SITE_OTHER): Payer: Medicaid Other | Admitting: Gastroenterology

## 2020-06-24 VITALS — Ht 61.0 in | Wt 171.0 lb

## 2020-06-24 DIAGNOSIS — R1084 Generalized abdominal pain: Secondary | ICD-10-CM | POA: Diagnosis not present

## 2020-06-24 DIAGNOSIS — K219 Gastro-esophageal reflux disease without esophagitis: Secondary | ICD-10-CM

## 2020-06-24 NOTE — Patient Instructions (Signed)
If you are age 33 or older, your body mass index should be between 23-30. Your Body mass index is 32.31 kg/m. If this is out of the aforementioned range listed, please consider follow up with your Primary Care Provider.  If you are age 63 or younger, your body mass index should be between 19-25. Your Body mass index is 32.31 kg/m. If this is out of the aformentioned range listed, please consider follow up with your Primary Care Provider.   Please go to [facility] to do your abdominal x ray  Thank you,  Dr. Lynann Bologna

## 2020-06-24 NOTE — Progress Notes (Signed)
Chief Complaint: FU  Referring Provider:  Dr Ardelle Park      ASSESSMENT AND PLAN;   #1.  Generalized abdominal pain d/t #2  #2. IBS-C. Most recently had diarrhea (?spurious).  Nl CBC, CMP, lipase 11/12/2018. Nl CBC, CT AP 10/15/2019, neg colon to TI 08/2018. Remote H/O C.Diff. Neg colon 08/2018. Failed linzess, benefiber, metamucil and colace.  #3. GERD.  ENT eval with LPR. Neg barium swallow 01/2020  #4. Co-morbid conditions include hypothyroidism (last TSH was Nl), chronic fatigue, IC, tremor, narcolepsy, asthma, fibromyalgia and sleep apnea.  #5.  Marijuana/tobacco abuse.  Plan: - Continue protonix 40mg  po BID as suggested by ENT (#90, 6 refills). - Continue milk of magnesia.  If she still has problems, will give her a trial of trulance/montergrity. - X-ray KUB 2view - Stop smoking and marijuana use. - If any further diarrhea, would recommend GI stool pathogen - FU as needed.  Earlier, if still with problems.    HPI:    Amber [name] is a 34 y.o. female ([name]) FU Wanted televisit d/t transportation problems  Overall stable from GI standpoint.  Always has chronic generalized abdominal pain.  Most recently had some diarrhea mostly at night.  She has been given BuSpar for anxiety which helped with the diarrhea.  She has been given Keflex for UTI.  She has history of C. difficile colitis in the past.  Per patient it does not "sound like C. difficile".  Nevertheless, I have told her to minimize future antibiotic use.  Seen in ED on 10/15/2019 d/t generalized abdominal pain. Dx with UTI -treated with Keflex x 7 days.  Underwent CT scan Abdo/pelvis with p.o. and IV contrast which was unremarkable.  She had normal pancreas.  CBC, CMP, lipase were normal.  H/o longstanding constipation.  Could not tolerate MiraLAX and multiple other medications as listed above including Linzess.  She is better with MOM.  No fever or chiils  Had had EGD and colonoscopy which were negative as  above.  Previously had been having sharp neck pains with questionable dysphagia.  Had barium swallow on 02/07/2020 which was unremarkable.  She was seen by ENT and was diagnosed as having LPR.  She had negative nasopharyngoscopy otherwise.  Protonix was increased to 40 mg p.o. twice daily with good results.  He continues to smoke and use marijuana despite of medical advice.    From previous note: -CT Abdo[facility]pelvis 10/15/2019 at [facility]: No acute abnormalities. -Barium swallow 02/07/2020 unremarkable.  Barium tablet passed without any problems. -CT scan on 11/12/2018- ?enterocolitis.  Treated with metronidazole and Cipro.  Repeat CT scan was unremarkable.  -Continues to use marijuana -Has sleeping disorders- narcolepsy/sleep apnea, on ritalin/CPAP.  Past Medical History:  Diagnosis Date  . Acute pharyngitis, unspecified   . Allergic rhinitis   . Anemia   . Anxiety   . Arthritis   . Asthma 04/14/2016  . Chronic fatigue   . Chronic nausea   . Clotting disorder (HCC)   . Colitis   . COPD (chronic obstructive pulmonary disease) (HCC)   . Cystitis, unspecified without hematuria   . Depression   . Dizziness 08/24/2017  . Dyspnea   . Fibromyalgia   . Fluid retention   . GERD (gastroesophageal reflux disease)   . Herpes simplex type 2 infection   . History of Clostridioides difficile colitis   . HPV (human papilloma virus) infection   . Hypertensive disorder 02/16/2018  . Hyperthyroidism   . Hypothyroidism 04/03/2019  . IBS (  irritable bowel syndrome)   . 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 Migraine headache   . Mild persistent asthma, uncomplicated   . Narcolepsy 2019  . Obstructive sleep apnea on CPAP 08/24/2017  . Palpitations   . Raynaud's disease without gangrene 04/03/2019  . Raynaud's syndrome without gangrene   . RMSF [facility] spotted fever) 08/24/2017  . Sleep apnea   . Tachycardia   . Tick-borne relapsing fever   . Tobacco abuse 08/24/2017  . Tremor   . Trigger thumb     and inflammation    Past Surgical History:  Procedure Laterality Date  . APPENDECTOMY    . COLONOSCOPY  04/14/2017   Dr Jennye Boroughs. Normal colonoscopy to the terminal ileum   . DILATION AND CURETTAGE OF UTERUS  2013  . DILATION AND EVACUATION  02/02/2012   Procedure: DILATATION AND EVACUATION;  Surgeon: Levi Aland, MD;  Location: [facility];  Service: Gynecology;  Laterality: N/A;  . ESOPHAGOGASTRODUODENOSCOPY  04/14/2017   Dr. Jennye Boroughs. Normal upper endoscopy. Empiric esophageal dilation to 50 [place]    Family History  Problem Relation Age of Onset  . Cancer Maternal Aunt   . Hypertension Maternal Grandmother   . Vision loss Maternal Grandmother   . Cancer Maternal Grandmother   . Breast cancer Paternal Grandmother   . Heart disease Father     Social History   Tobacco Use  . Smoking status: Current Every Day Smoker    Packs/day: 1.00    Years: 16.00    Pack years: 16.00    Types: Cigarettes  . Smokeless tobacco: Never Used  Vaping Use  . Vaping Use: Former  Substance Use Topics  . Alcohol use: No  . Drug use: Yes    Types: Marijuana    Current Outpatient Medications  Medication Sig Dispense Refill  . albuterol (VENTOLIN HFA) 108 (90 Base) MCG/ACT inhaler Inhale 1 puff into the lungs daily. Can do up to 4 times daily    . AMBULATORY NON FORMULARY MEDICATION as directed. CPAP machine    . amitriptyline (ELAVIL) 75 MG tablet Take 75 mg by mouth at bedtime.    Marland Kitchen amLODipine (NORVASC) 5 MG tablet Take 5 mg by mouth daily.    . busPIRone (BUSPAR) 15 MG tablet 1 tablet 3 (three) times daily.    . Fluticasone-Umeclidin-Vilant (TRELEGY ELLIPTA) 100-62.5-25 MCG/INH AEPB Inhale 1 puff into the lungs daily.    . hyoscyamine (LEVSIN SL) 0.125 MG SL tablet TAKE ONE TABLET EVERY 4-6 HOURS AS NEEDED. 120 tablet 1  . ipratropium-albuterol (DUONEB) 0.5-2.5 (3) MG/3ML SOLN Take 3 mLs by nebulization as directed.    Marland Kitchen levothyroxine (SYNTHROID) 100 MCG tablet 1 tablet daily.     Marland Kitchen losartan (COZAAR) 25 MG tablet Take 25 mg by mouth daily.    . Magnesium Hydroxide (MILK OF MAGNESIA PO) Take by mouth daily. 4 tablespoons daily    . meloxicam (MOBIC) 7.5 MG tablet Take 7.5 mg by mouth daily.    . methylphenidate (RITALIN) 20 MG tablet 1 tablet 3 (three) times daily.    . metoprolol succinate (TOPROL XL) 25 MG 24 hr tablet Take 0.5 tablets (12.5 mg total) by mouth daily. 45 tablet 1  . montelukast (SINGULAIR) 10 MG tablet Take 10 mg by mouth daily.    . ondansetron (ZOFRAN-ODT) 8 MG disintegrating tablet Take 1 tablet by mouth daily.     . pantoprazole (PROTONIX) 40 MG tablet Take 1 tablet (40 mg total) by mouth daily. (Patient taking differently: Take  40 mg by mouth 2 (two) times daily. ) 30 tablet 6  . pregabalin (LYRICA) 150 MG capsule Take 150 mg by mouth 2 (two) times daily.    Marland Kitchen PROAIR HFA 108 (90 Base) MCG/ACT inhaler Inhale 2 puffs into the lungs every 4 (four) hours as needed.     Marland Kitchen spironolactone (ALDACTONE) 100 MG tablet Take 100 mg by mouth daily.    Marland Kitchen topiramate (TOPAMAX) 50 MG tablet Take 50 mg by mouth 2 (two) times daily.    . traMADol (ULTRAM) 50 MG tablet Take 50 mg by mouth daily.     . valACYclovir (VALTREX) 500 MG tablet 1 tablet daily.     Marland Kitchen venlafaxine XR (EFFEXOR-XR) 150 MG 24 hr capsule Take 150 mg by mouth daily.     No current facility-administered medications for this visit.    Allergies  Allergen Reactions  . Sulfa Antibiotics Nausea And Vomiting and Other (See Comments)  . Amoxicillin-Pot Clavulanate Nausea And Vomiting  . Bactrim [Sulfamethoxazole-Trimethoprim] Nausea And Vomiting    Review of Systems:  Neg except HPI    Physical Exam:    Ht 5\' 1"  (1.549 m)   Wt 171 lb (77.6 kg)   BMI 32.31 kg/m  Filed Weights   06/24/20 0841  Weight: 171 lb (77.6 kg)  Not examined since it was a tele-visit   Data Reviewed: I have personally reviewed following labs and imaging studies  CBC: CBC Latest Ref Rng & Units 07/22/2014 02/27/2014  02/02/2012  WBC 4.0 - 10.5 K/uL 10.8(H) 8.1 14.1(H)  Hemoglobin 12.0 - 15.0 g/dL 02/04/2012 69.6 78.9  Hematocrit 36 - 46 % 41.0 42.1 40.9  Platelets 150 - 400 K/uL 232 216 273    CMP: CMP Latest Ref Rng & Units 07/03/2019 05/06/2019 12/06/2018  Glucose 65 - 99 mg/dL 74 12/08/2018) 79  BUN 6 - 20 mg/dL 8 12 8   Creatinine 0.57 - 1.00 mg/dL 017(P 1.02  Sodium 134 - 144 mmol/L 133(L) 135 137  Potassium 3.5 - 5.2 mmol/L 4.2 3.8 4.2  Chloride 96 - 106 mmol/L 96 97 101  CO2 20 - 29 mmol/L 22 22 27   Calcium 8.7 - 10.2 mg/dL 5.85 9.3 9.7  Total Protein 6.0 - 8.3 g/dL - - 7.3  Total Bilirubin 0.2 - 1.2 mg/dL - - 0.6  Alkaline Phos 39 - 117 U/L - - 64  AST 0 - 37 U/L - - 23  ALT 0 - 35 U/L - - 20   This service was provided via telemedicine at patient's request using telephone (no Internet connection).  The patient was located at home.  The provider was located in office.  The patient did consent to this telephone visit and is aware of possible charges through their insurance for this visit.  The patient was referred by Dr. 2.77.   Time spent on call/coordination of care/review of extensive records: 25 min  Patient was at home.  Physician in office     , MD 06/24/2020, 11:22 AM  Cc: Dr Ardelle Park

## 2020-06-29 ENCOUNTER — Telehealth: Payer: Self-pay | Admitting: Gastroenterology

## 2020-06-29 NOTE — Telephone Encounter (Signed)
Incoming call from [facility]. Was placed on hold for several minutes regarding Barium Swallow order. I will contact them tomorrow to answerer any questions.

## 2020-06-30 NOTE — Telephone Encounter (Signed)
Left message with the X-Ray department to call back.

## 2020-07-21 ENCOUNTER — Other Ambulatory Visit: Payer: Self-pay

## 2020-07-21 DIAGNOSIS — R5382 Chronic fatigue, unspecified: Secondary | ICD-10-CM | POA: Insufficient documentation

## 2020-07-21 DIAGNOSIS — Z98818 Other dental procedure status: Secondary | ICD-10-CM | POA: Insufficient documentation

## 2020-07-21 DIAGNOSIS — B977 Papillomavirus as the cause of diseases classified elsewhere: Secondary | ICD-10-CM | POA: Insufficient documentation

## 2020-07-21 DIAGNOSIS — R2231 Localized swelling, mass and lump, right upper limb: Secondary | ICD-10-CM | POA: Insufficient documentation

## 2020-07-21 DIAGNOSIS — E559 Vitamin D deficiency, unspecified: Secondary | ICD-10-CM | POA: Insufficient documentation

## 2020-07-21 DIAGNOSIS — E119 Type 2 diabetes mellitus without complications: Secondary | ICD-10-CM | POA: Insufficient documentation

## 2020-07-21 DIAGNOSIS — E782 Mixed hyperlipidemia: Secondary | ICD-10-CM | POA: Insufficient documentation

## 2020-07-21 DIAGNOSIS — Z8619 Personal history of other infectious and parasitic diseases: Secondary | ICD-10-CM | POA: Insufficient documentation

## 2020-07-21 DIAGNOSIS — R110 Nausea: Secondary | ICD-10-CM | POA: Insufficient documentation

## 2020-07-21 DIAGNOSIS — D689 Coagulation defect, unspecified: Secondary | ICD-10-CM | POA: Insufficient documentation

## 2020-07-21 DIAGNOSIS — G4730 Sleep apnea, unspecified: Secondary | ICD-10-CM | POA: Insufficient documentation

## 2020-07-21 DIAGNOSIS — K589 Irritable bowel syndrome without diarrhea: Secondary | ICD-10-CM | POA: Insufficient documentation

## 2020-07-21 DIAGNOSIS — N3090 Cystitis, unspecified without hematuria: Secondary | ICD-10-CM | POA: Insufficient documentation

## 2020-07-21 DIAGNOSIS — J4530 Mild persistent asthma, uncomplicated: Secondary | ICD-10-CM | POA: Insufficient documentation

## 2020-07-21 DIAGNOSIS — R2230 Localized swelling, mass and lump, unspecified upper limb: Secondary | ICD-10-CM | POA: Insufficient documentation

## 2020-07-21 DIAGNOSIS — D518 Other vitamin B12 deficiency anemias: Secondary | ICD-10-CM | POA: Insufficient documentation

## 2020-07-21 DIAGNOSIS — R000 Tachycardia, unspecified: Secondary | ICD-10-CM | POA: Insufficient documentation

## 2020-07-21 DIAGNOSIS — J302 Other seasonal allergic rhinitis: Secondary | ICD-10-CM | POA: Insufficient documentation

## 2020-07-21 DIAGNOSIS — M2560 Stiffness of unspecified joint, not elsewhere classified: Secondary | ICD-10-CM | POA: Insufficient documentation

## 2020-07-21 DIAGNOSIS — R0600 Dyspnea, unspecified: Secondary | ICD-10-CM | POA: Insufficient documentation

## 2020-07-21 DIAGNOSIS — R112 Nausea with vomiting, unspecified: Secondary | ICD-10-CM | POA: Insufficient documentation

## 2020-07-21 DIAGNOSIS — R609 Edema, unspecified: Secondary | ICD-10-CM | POA: Insufficient documentation

## 2020-07-21 DIAGNOSIS — K529 Noninfective gastroenteritis and colitis, unspecified: Secondary | ICD-10-CM | POA: Insufficient documentation

## 2020-07-21 DIAGNOSIS — R251 Tremor, unspecified: Secondary | ICD-10-CM | POA: Insufficient documentation

## 2020-07-21 DIAGNOSIS — F331 Major depressive disorder, recurrent, moderate: Secondary | ICD-10-CM | POA: Insufficient documentation

## 2020-07-21 DIAGNOSIS — B009 Herpesviral infection, unspecified: Secondary | ICD-10-CM | POA: Insufficient documentation

## 2020-07-21 DIAGNOSIS — G609 Hereditary and idiopathic neuropathy, unspecified: Secondary | ICD-10-CM | POA: Insufficient documentation

## 2020-07-21 DIAGNOSIS — M65319 Trigger thumb, unspecified thumb: Secondary | ICD-10-CM | POA: Insufficient documentation

## 2020-07-21 DIAGNOSIS — A681 Tick-borne relapsing fever: Secondary | ICD-10-CM | POA: Insufficient documentation

## 2020-07-21 DIAGNOSIS — F419 Anxiety disorder, unspecified: Secondary | ICD-10-CM | POA: Insufficient documentation

## 2020-07-21 DIAGNOSIS — G9332 Myalgic encephalomyelitis/chronic fatigue syndrome: Secondary | ICD-10-CM | POA: Insufficient documentation

## 2020-07-22 ENCOUNTER — Telehealth (INDEPENDENT_AMBULATORY_CARE_PROVIDER_SITE_OTHER): Payer: Medicaid Other | Admitting: Cardiology

## 2020-07-22 ENCOUNTER — Encounter: Payer: Self-pay | Admitting: Cardiology

## 2020-07-22 VITALS — Ht 61.0 in | Wt 170.0 lb

## 2020-07-22 DIAGNOSIS — R002 Palpitations: Secondary | ICD-10-CM | POA: Diagnosis not present

## 2020-07-22 DIAGNOSIS — E119 Type 2 diabetes mellitus without complications: Secondary | ICD-10-CM | POA: Diagnosis not present

## 2020-07-22 DIAGNOSIS — I10 Essential (primary) hypertension: Secondary | ICD-10-CM

## 2020-07-22 MED ORDER — PROPRANOLOL HCL 20 MG PO TABS: 20.0000 mg | ORAL_TABLET | Freq: Two times a day (BID) | ORAL | 3 refills | Status: DC

## 2020-07-22 NOTE — Patient Instructions (Signed)
Medication Instructions:  Your physician has recommended you make the following change in your medication:  1) STOP taking Toprol XL  2) START taking propranolol 20 mg twice daily  *If you need a refill on your cardiac medications before your next appointment, please call your pharmacy*  Follow-Up: At [facility], you and your health needs are our priority.  As part of our continuing mission to provide you with exceptional heart care, we have created designated Provider Care Teams.  These Care Teams include your primary Cardiologist (physician) and Advanced Practice Providers (APPs -  Physician Assistants and Nurse Practitioners) who all work together to provide you with the care you need, when you need it.  Your next appointment:   4 week(s)  The format for your next appointment:   Virtual Visit   Provider:   Thomasene Ripple, DO

## 2020-07-22 NOTE — Progress Notes (Signed)
Telephone visit  Date:  07/22/2020   ID:  Amber [name], DOB 1985-12-30, MRN [id number]  The patient is at home I met the office  PCP:  Amber Nasuti, MD  Cardiologist:  Amber Salines, DO  Electrophysiologist:  None   Evaluation Performed: Follow-up  Chief Complaint: I am still experiencing worsening palpitations  History of Present Illness:    Amber A Keigeris a 35 y.o.femalewith a hypertension diagnosed 1 year ago, diabetes type 2, hyperlipidemia,raynaud'ssyndrome, anxiety disorder presents today to be evaluated for chest pain, and palpitations.  During her initial visit I was able to review her monitor report with her which was performed in July 2020 that showed that her heart rate went as high as 160 otherwise no evidence of PACs, PVCs or any atrial arrhythmias. At the time I did educate the patient that her sinus tachycardia was secondary likely to her anxiety. For the complaint shortness of breath echocardiogram was ordered and a CTA due to family history and her other medical conditions.  I saw the patient on 07/03/2019 at that visit we discussed her TTE and her CCTA results. At that time she also did tell me that she was taken of Lasix and was now taking Aldactone. She is still short of breat at times.   At her last visit on October 01, 2019 we discussed her testing result..  Today she reports that the palpitations are getting worse even on the metoprolol.  She is concerned about this.  The patient does not have symptoms concerning for COVID-19 infection- she requested this visit as she is unable to wear mask to come for her office visit.   Past Medical History:  Diagnosis Date  . Acute pharyngitis, unspecified   . Allergic rhinitis   . Anemia   . Anxiety   . Arthritis   . Asthma 04/14/2016  . Chronic fatigue   . Chronic nausea   . Clotting disorder ([place])   . Colitis   . COPD (chronic obstructive pulmonary disease) ([place])   . Cystitis, unspecified without  hematuria   . Depression   . Dizziness 08/24/2017  . Dyspnea   . Fibromyalgia   . Fluid retention   . GERD (gastroesophageal reflux disease)   . Herpes simplex type 2 infection   . History of Clostridioides difficile colitis   . HPV (human papilloma virus) infection   . Hypertensive disorder 02/16/2018  . Hyperthyroidism   . Hypothyroidism 04/03/2019  . IBS (irritable bowel syndrome)   . 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 Migraine headache   . Mild persistent asthma, uncomplicated   . Narcolepsy 2019  . Obstructive sleep apnea on CPAP 08/24/2017  . Palpitations   . Raynaud's disease without gangrene 04/03/2019  . Raynaud's syndrome without gangrene   . [facility] spotted fever) 08/24/2017  . Sleep apnea   . Tachycardia   . Tick-borne relapsing fever   . Tobacco abuse 08/24/2017  . Tremor   . Trigger thumb    and inflammation   Past Surgical History:  Procedure Laterality Date  . APPENDECTOMY    . COLONOSCOPY  04/14/2017   Dr Lyda Jester. Normal colonoscopy to the terminal ileum   . DILATION AND CURETTAGE OF UTERUS  2013  . DILATION AND EVACUATION  02/02/2012   Procedure: DILATATION AND EVACUATION;  Surgeon: Olga Millers, MD;  Location: [facility];  Service: Gynecology;  Laterality: N/A;  . ESOPHAGOGASTRODUODENOSCOPY  04/14/2017   Dr. Lyda Jester. Normal upper endoscopy. Empiric esophageal dilation to  50 [place]     Current Meds  Medication Sig  . albuterol (VENTOLIN HFA) 108 (90 Base) MCG/ACT inhaler Inhale 1 puff into the lungs daily. Can do up to 4 times daily  . AMBULATORY NON FORMULARY MEDICATION as directed. CPAP machine  . amitriptyline (ELAVIL) 75 MG tablet Take 75 mg by mouth at bedtime.  Marland Kitchen amLODipine (NORVASC) 5 MG tablet Take 5 mg by mouth daily.  . busPIRone (BUSPAR) 15 MG tablet 1 tablet 3 (three) times daily.  . cefUROXime (CEFTIN) 500 MG tablet cefuroxime axetil 500 mg tablet  TAKE 1 TABLET BY MOUTH EVERY 12 HOURS FOR 5 DAYS  . chlorhexidine  (PERIDEX) 0.12 % solution chlorhexidine gluconate 0.12 % mouthwash  PLEASE SEE ATTACHED FOR DETAILED DIRECTIONS  . ciprofloxacin (CIPRO) 500 MG tablet ciprofloxacin 500 mg tablet  TAKE 1 TABLET BY MOUTH TWICE A DAY  . clindamycin (CLEOCIN) 300 MG capsule Take by mouth.  . fluticasone (FLONASE) 50 MCG/ACT nasal spray Place into the nose.  . fluticasone furoate-vilanterol (BREO ELLIPTA) 200-25 MCG/INH AEPB Inhale into the lungs.  . Fluticasone-Salmeterol (ADVAIR) 250-50 MCG/DOSE AEPB Advair Diskus 250 mcg-50 mcg/dose powder for inhalation  INHALE 1 PUFF BY MOUTH TWICE A DAY  . Fluticasone-Umeclidin-Vilant (TRELEGY ELLIPTA) 100-62.5-25 MCG/INH AEPB Inhale 1 puff into the lungs daily.  . furosemide (LASIX) 40 MG tablet furosemide 40 mg tablet  TAKE 1 TABLET BY MOUTH EVERY DAY  . Galcanezumab-gnlm (EMGALITY) 120 MG/ML SOAJ Emgality Pen 120 mg/mL subcutaneous pen injector  INJECT 1 ML ONCE A MONTH  . HYDROcodone-acetaminophen (NORCO/VICODIN) 5-325 MG tablet hydrocodone 5 mg-acetaminophen 325 mg tablet  TAKE 1 TABLET BY MOUTH EVERY 6 HOURS AS NEEDED FOR 5 DAYS  . hydrocortisone (ANUSOL-HC) 2.5 % rectal cream SMARTSIG:Sparingly Topical 2-4 Times Daily PRN  . hydrocortisone 2.5 % cream hydrocortisone 2.5 % topical cream with perineal applicator  APPLY SPARINGLY TO AFFECTED AREA 2 TO 4 TIMES A DAY  . hyoscyamine (LEVSIN SL) 0.125 MG SL tablet TAKE ONE TABLET EVERY 4-6 HOURS AS NEEDED.  Marland Kitchen ipratropium-albuterol (DUONEB) 0.5-2.5 (3) MG/3ML SOLN Take 3 mLs by nebulization as directed.  Marland Kitchen levofloxacin (LEVAQUIN) 250 MG tablet Take 250 mg by mouth daily.  Marland Kitchen levothyroxine (SYNTHROID) 100 MCG tablet 1 tablet daily.  Marland Kitchen lidocaine (XYLOCAINE) 5 % ointment lidocaine 5 % topical ointment  1 APPLICATION AS NEEDED THREE TIMES A DAY EXTERNALLY 30 DAYS  . losartan (COZAAR) 25 MG tablet Take 25 mg by mouth daily.  . Magnesium Hydroxide (MILK OF MAGNESIA PO) Take by mouth daily. 4 tablespoons daily  . meloxicam  (MOBIC) 7.5 MG tablet Take 7.5 mg by mouth daily.  . methylphenidate (RITALIN) 20 MG tablet 1 tablet 3 (three) times daily.  . metroNIDAZOLE (FLAGYL) 500 MG tablet Take 1 tablet by mouth 3 (three) times daily.  . montelukast (SINGULAIR) 10 MG tablet Take 10 mg by mouth daily.  . nitrofurantoin, macrocrystal-monohydrate, (MACROBID) 100 MG capsule Take 100 mg by mouth 2 (two) times daily.  Marland Kitchen omeprazole (PRILOSEC) 20 MG capsule omeprazole 20 mg capsule,delayed release  TAKE 1 CAPSULE BY MOUTH EVERY DAY  . ondansetron (ZOFRAN) 8 MG tablet Take by mouth.  . ondansetron (ZOFRAN-ODT) 8 MG disintegrating tablet Take 1 tablet by mouth daily.   Marland Kitchen oxyCODONE-acetaminophen (PERCOCET/ROXICET) 5-325 MG tablet oxycodone-acetaminophen 5 mg-325 mg tablet  TAKE 1 TABLET BY MOUTH EVERY 4 HOURS  . pantoprazole (PROTONIX) 40 MG tablet Take 1 tablet (40 mg total) by mouth daily. (Patient taking differently: Take 40 mg by mouth  2 (two) times daily.)  . phenazopyridine (PYRIDIUM) 100 MG tablet Take 200 mg by mouth 3 (three) times daily.  . pregabalin (LYRICA) 150 MG capsule Take 150 mg by mouth 2 (two) times daily.  Marland Kitchen PROAIR HFA 108 (90 Base) MCG/ACT inhaler Inhale 2 puffs into the lungs every 4 (four) hours as needed.   . promethazine (PHENERGAN) 25 MG tablet promethazine 25 mg tablet  TAKE 1 TABLET BY MOUTH EVERY 8 HOURS AS NEEDED FOR NAUSEA/VOMITING  . propranolol (INDERAL) 20 MG tablet Take 1 tablet (20 mg total) by mouth 2 (two) times daily.  . rizatriptan (MAXALT) 10 MG tablet Take by mouth.  . spironolactone (ALDACTONE) 100 MG tablet Take 100 mg by mouth daily.  Marland Kitchen sulfamethoxazole-trimethoprim (BACTRIM DS) 800-160 MG tablet Take 1 tablet by mouth 2 (two) times daily.  Marland Kitchen terconazole (TERAZOL 3) 0.8 % vaginal cream SMARTSIG:1 Applicator Vaginal Daily  . Tiotropium Bromide Monohydrate (SPIRIVA RESPIMAT) 1.25 MCG/ACT AERS Spiriva Respimat 1.25 mcg/actuation solution for inhalation  INHALE 2 PUFFS ONCE DAILY  .  topiramate (TOPAMAX) 50 MG tablet Take 50 mg by mouth 2 (two) times daily.  . traMADol (ULTRAM) 50 MG tablet Take 50 mg by mouth daily.   . valACYclovir (VALTREX) 500 MG tablet 1 tablet daily.   Marland Kitchen venlafaxine XR (EFFEXOR-XR) 150 MG 24 hr capsule Take 150 mg by mouth daily.  . [DISCONTINUED] busPIRone (BUSPAR) 7.5 MG tablet Take 7.5 mg by mouth 3 (three) times daily.  . [DISCONTINUED] metoprolol succinate (TOPROL XL) 25 MG 24 hr tablet Take 0.5 tablets (12.5 mg total) by mouth daily.  . [DISCONTINUED] metoprolol tartrate (LOPRESSOR) 25 MG tablet Take 25 mg by mouth 2 (two) times daily.     Allergies:   Amoxicillin-pot clavulanate, Sulfa antibiotics, and Sulfamethoxazole-trimethoprim   Social History   Tobacco Use  . Smoking status: Current Every Day Smoker    Packs/day: 1.00    Years: 16.00    Pack years: 16.00    Types: Cigarettes  . Smokeless tobacco: Never Used  Vaping Use  . Vaping Use: Former  Substance Use Topics  . Alcohol use: No  . Drug use: Yes    Types: Marijuana     Family Hx: The patient's family history includes Breast cancer in her paternal grandmother; Cancer in her maternal aunt and maternal grandmother; Heart disease in her father; Hypertension in her maternal grandmother; Vision loss in her maternal grandmother.  ROS:   Review of Systems  Constitution: Negative for decreased appetite, fever and weight gain.  HENT: Negative for congestion, ear discharge, hoarse voice and sore throat.   Eyes: Negative for discharge, redness, vision loss in right eye and visual halos.  Cardiovascular: Reports palpitations.  Negative for chest pain, dyspnea on exertion, leg swelling, orthopnea Respiratory: Negative for cough, hemoptysis, shortness of breath and snoring.   Endocrine: Negative for heat intolerance and polyphagia.  Hematologic/Lymphatic: Negative for bleeding problem. Does not bruise/bleed easily.  Skin: Negative for flushing, nail changes, rash and suspicious  lesions.  Musculoskeletal: Negative for arthritis, joint pain, muscle cramps, myalgias, neck pain and stiffness.  Gastrointestinal: Negative for abdominal pain, bowel incontinence, diarrhea and excessive appetite.  Genitourinary: Negative for decreased libido, genital sores and incomplete emptying.  Neurological: Negative for brief paralysis, focal weakness, headaches and loss of balance.  Psychiatric/Behavioral: Negative for altered mental status, depression and suicidal ideas.  Allergic/Immunologic: Negative for HIV exposure and persistent infections.     Prior CV studies:   The following studies were reviewed today:  Transthoracic echocardiogramIMPRESSIONS 04/04/19 1. Left ventricular ejection fraction, by visual estimation, is 60 to 65%. The left ventricle has normal function. Normal left ventricular size. There is no left ventricular hypertrophy. 2. Global right ventricle has normal systolic function.The right ventricular size is normal. No increase in right ventricular wall thickness. 3. Left atrial size was normal. 4. Right atrial size was normal. 5. The mitral valve is normal in structure. Trace mitral valve regurgitation. No evidence of mitral stenosis. 6. The tricuspid valve is normal in structure. Tricuspid valve regurgitation was not visualized by color flow Doppler. 7. The aortic valve is normal in structure. Aortic valve regurgitation was not visualized by color flow Doppler. Structurally normal aortic valve, with no evidence of sclerosis or stenosis. 8. The pulmonic valve was normal in structure. Pulmonic valve regurgitation is not visualized by color flow Doppler. 9. The inferior vena cava is normal in size with greater than 50% respiratory variability, suggesting right atrial pressure of 3 mmHg.  Coronary CTA FINDINGS:05/31/2019 Coronary calcium score: The patient's coronary artery calcium score is 0, which places the patient in the 0 percentile.  Coronary  arteries: Normal coronary origins. Right dominance.  Right Coronary Artery: Normal caliber vessel, gives rise to PDA. No significant plaque or stenosis.  Left Main Coronary Artery: Very short trunk, common ostium but then almost immediately bifurcates. No significant plaque or stenosis.  Left Anterior Descending Coronary Artery: Normal caliber vessel. No significant plaque or stenosis. Gives rise to 1 medium diagonal branch.  Left Circumflex Artery: Normal caliber vessel. No significant plaque or stenosis. Gives rise to 1 large OM branches.  Aorta: Normal size, 26 mm at the mid ascending aorta (level of the PA bifurcation) measured double oblique. No calcifications. No dissection.  Aortic Valve: No calcifications. Trileaflet.  Other findings:  Normal pulmonary vein drainage into the left atrium.  Normal left atrial appendage without a thrombus.  Normal size of the pulmonary artery.  IMPRESSION: 1. No evidence of CAD, CADRADS = 0.  2. Coronary calcium score of 0. This was 0 percentile for age and sex matched control.  3. Normal coronary origin with right dominance.  4. Overall small caliber vessels without any plaque or stenosis  Labs/Other Tests and Data Reviewed:    EKG: None today  Recent Labs: No results found for requested labs within last 8760 hours.   Recent Lipid Panel No results found for: CHOL, TRIG, HDL, CHOLHDL, LDLCALC, LDLDIRECT  Wt Readings from Last 3 Encounters:  07/22/20 170 lb (77.1 kg)  06/24/20 171 lb (77.6 kg)  03/19/20 168 lb (76.2 kg)     Objective:    Vital Signs:  Ht $R'5\' 1"'fv$  (1.549 m)   Wt 170 lb (77.1 kg)   BMI 32.12 kg/m    Unable to perform physical exam virtual visit  ASSESSMENT & PLAN:    1. Palpitations  2. Hypertension  Given your frequent breakthroughs on metoprolol and knowing that the patient does have anxiety I like to stop the metoprolol and start the patient on propanolol 20 mg twice a day.  Hoping  this can help.  She is agreeable to this change.  I will see her in 4 weeks to see if there is any change in her symptoms.  She is unable to take her blood pressure at home.  We discussed other ways in which she can hopefully get blood pressure we may discuss with our social work group to see if there is a way of donating her blood pressure cuff  that is patient.  4 weeks follow-up  COVID-19 Education: The signs and symptoms of COVID-19 were discussed with the patient and how to seek care for testing (follow up with PCP or arrange E-visit).  The importance of social distancing was discussed today.  Time:   Today, I have spent 10 minutes minutes with the patient with telehealth technology discussing the above problems.     Medication Adjustments/Labs and Tests Ordered: Current medicines are reviewed at length with the patient today.  Concerns regarding medicines are outlined above.   Tests Ordered: No orders of the defined types were placed in this encounter.   Medication Changes: Meds ordered this encounter  Medications  . propranolol (INDERAL) 20 MG tablet    Sig: Take 1 tablet (20 mg total) by mouth 2 (two) times daily.    Dispense:  180 tablet    Refill:  3    Follow Up: 4 weeks  Signed, Amber Salines, DO  07/22/2020 12:22 PM    [facility]

## 2020-08-17 ENCOUNTER — Telehealth: Payer: Self-pay | Admitting: Cardiology

## 2020-08-17 NOTE — Telephone Encounter (Signed)
New Message:      Pt wants to know if she can have a Virtual Visit with Dr Servando Salina on Wednesday please? She said she can not wear the mask.

## 2020-08-18 ENCOUNTER — Other Ambulatory Visit: Payer: Self-pay

## 2020-08-18 DIAGNOSIS — J309 Allergic rhinitis, unspecified: Secondary | ICD-10-CM | POA: Insufficient documentation

## 2020-08-18 DIAGNOSIS — F32A Depression, unspecified: Secondary | ICD-10-CM | POA: Insufficient documentation

## 2020-08-18 DIAGNOSIS — M797 Fibromyalgia: Secondary | ICD-10-CM | POA: Insufficient documentation

## 2020-08-18 DIAGNOSIS — R5382 Chronic fatigue, unspecified: Secondary | ICD-10-CM | POA: Insufficient documentation

## 2020-08-18 DIAGNOSIS — K219 Gastro-esophageal reflux disease without esophagitis: Secondary | ICD-10-CM | POA: Insufficient documentation

## 2020-08-18 DIAGNOSIS — J029 Acute pharyngitis, unspecified: Secondary | ICD-10-CM | POA: Insufficient documentation

## 2020-08-18 DIAGNOSIS — M1990 Unspecified osteoarthritis, unspecified site: Secondary | ICD-10-CM | POA: Insufficient documentation

## 2020-08-18 DIAGNOSIS — D649 Anemia, unspecified: Secondary | ICD-10-CM | POA: Insufficient documentation

## 2020-08-18 DIAGNOSIS — J449 Chronic obstructive pulmonary disease, unspecified: Secondary | ICD-10-CM | POA: Insufficient documentation

## 2020-08-19 ENCOUNTER — Other Ambulatory Visit: Payer: Self-pay

## 2020-08-19 ENCOUNTER — Encounter: Payer: Self-pay | Admitting: Cardiology

## 2020-08-19 ENCOUNTER — Telehealth (INDEPENDENT_AMBULATORY_CARE_PROVIDER_SITE_OTHER): Payer: Medicaid Other | Admitting: Cardiology

## 2020-08-19 VITALS — BP 116/79 | HR 78 | Ht 62.0 in | Wt 165.0 lb

## 2020-08-19 DIAGNOSIS — Z538 Procedure and treatment not carried out for other reasons: Secondary | ICD-10-CM

## 2020-08-19 NOTE — Progress Notes (Signed)
q 

## 2020-09-14 ENCOUNTER — Telehealth: Payer: Self-pay | Admitting: Gastroenterology

## 2020-09-14 ENCOUNTER — Telehealth: Payer: Self-pay | Admitting: Cardiology

## 2020-09-14 NOTE — Telephone Encounter (Signed)
Pt c/o medication issue:  1. Name of Medication: propranolol (INDERAL) 20 MG tablet  2. How are you currently taking this medication (dosage and times per day)? As directed   3. Are you having a reaction (difficulty breathing--STAT)? no  4. What is your medication issue? Pharmacy noticed there is tier 1 interaction between this medication and the albuterol (VENTOLIN HFA) 108 (90 Base) MCG/ACT inhaler.  Pharmacist said the patient will hold off on albuterol until they hear from our office with recommendations. Please advise

## 2020-09-14 NOTE — Telephone Encounter (Signed)
Spoke to patient to inform her that Dr Chales Abrahams does not have an appointment until 10/09/20. She stated she will call the [facility] office and try to get in with an APP.

## 2020-09-14 NOTE — Telephone Encounter (Signed)
Pt would like to be seen tomorrow. She stated that she went to ED [facility] last Friday  And was told that she a bleeding ulcer, a cyst in her ovary, and a UTI. She said that she cannot keep anything down. Pls call her.

## 2020-09-16 ENCOUNTER — Other Ambulatory Visit: Payer: Self-pay

## 2020-09-18 ENCOUNTER — Telehealth: Payer: Medicaid Other | Admitting: Cardiology

## 2020-09-24 ENCOUNTER — Ambulatory Visit: Payer: Medicaid Other | Admitting: Gastroenterology

## 2020-09-24 ENCOUNTER — Encounter: Payer: Self-pay | Admitting: Gastroenterology

## 2020-09-24 VITALS — BP 120/84 | HR 85 | Ht 61.0 in | Wt 161.0 lb

## 2020-09-24 DIAGNOSIS — R112 Nausea with vomiting, unspecified: Secondary | ICD-10-CM | POA: Diagnosis not present

## 2020-09-24 DIAGNOSIS — K219 Gastro-esophageal reflux disease without esophagitis: Secondary | ICD-10-CM

## 2020-09-24 NOTE — Patient Instructions (Signed)
If you are age 34 or older, your body mass index should be between 23-30. Your Body mass index is 30.42 kg/m. If this is out of the aforementioned range listed, please consider follow up with your Primary Care Provider.  If you are age 19 or younger, your body mass index should be between 19-25. Your Body mass index is 30.42 kg/m. If this is out of the aformentioned range listed, please consider follow up with your Primary Care Provider.   You have been scheduled for an endoscopy. Please follow written instructions given to you at your visit today. If you use inhalers (even only as needed), please bring them with you on the day of your procedure.  Due to recent changes in healthcare laws, you may see the results of your imaging and laboratory studies on MyChart before your provider has had a chance to review them.  We understand that in some cases there may be results that are confusing or concerning to you. Not all laboratory results come back in the same time frame and the provider may be waiting for multiple results in order to interpret others.  Please give [place] 48 hours in order for your provider to thoroughly review all the results before contacting the office for clarification of your results.

## 2020-09-24 NOTE — Progress Notes (Signed)
09/24/2020 Amber [name] [id number] 01-02-1986   HISTORY OF PRESENT ILLNESS: This is a 35 year old female is a patient of Dr. Urban Gibson.  She has multiple medical problems as listed below.  She is here today with complaints of nausea, vomiting, abdominal pain, reflux related issues.  She went to [facility] emergency department on February 25 for the same complaints.  She said she had been feeling well when all of this came on her suddenly.  A CT scan of the abdomen and pelvis with contrast showed no acute issues within the abdomen and pelvis.  It did show a 2.7 centimeter right adnexal cyst.  Labs showed a slightly elevated white blood cell count at 13.1.  Hemoglobin was normal at 14.1 g.  Platelets were normal.  CMP was unremarkable.  She did have a dirty urine was placed on antibiotics for urinary tract infection.  She reports that she was having red blood in her stool as well, but that has resolved.  She said that she was told she probably had a bleeding ulcer.  She followed up with her GYN in regards to the cyst on her ovary.  Once again they treated her for UTI, but she did not complete the antibiotics because she cannot keep anything down.  She says that everything has gotten a little bit better.  She is not taking any of her medications as she discontinued everything when she was having all the symptoms.  She has upcoming appointments with all of her other physicians/specialist as well so she will address other medication issues with them  Colonoscopy in June 2020 showed only internal hemorrhoids.  EGD in September 2018 by Dr. Jennye Boroughs showed a normal study.  Empiric dilation was performed for complaints of dysphagia.    Past Medical History:  Diagnosis Date  . Acute pharyngitis, unspecified   . Allergic rhinitis   . Anemia   . Anxiety   . Arthritis   . Asthma 04/14/2016  . Chronic fatigue   . Chronic nausea   . Clotting disorder (HCC)   . Colitis   . COPD (chronic obstructive  pulmonary disease) (HCC)   . Cystitis, unspecified without hematuria   . Depression   . Dizziness 08/24/2017  . Dyspnea   . Fibromyalgia   . Fluid retention   . GERD (gastroesophageal reflux disease)   . Herpes simplex type 2 infection   . History of Clostridioides difficile colitis   . HPV (human papilloma virus) infection   . Hypertensive disorder 02/16/2018  . Hyperthyroidism   . Hypothyroidism 04/03/2019  . IBS (irritable bowel syndrome)   . 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 Migraine headache   . Mild persistent asthma, uncomplicated   . Narcolepsy 2019  . Obstructive sleep apnea on CPAP 08/24/2017  . Palpitations   . Raynaud's disease without gangrene 04/03/2019  . Raynaud's syndrome without gangrene   . 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 Sleep apnea   . Tachycardia   . Tick-borne relapsing fever   . Tobacco abuse 08/24/2017  . Tremor   . Trigger thumb    and inflammation   Past Surgical History:  Procedure Laterality Date  . APPENDECTOMY    . COLONOSCOPY  04/14/2017   Dr Jennye Boroughs. Normal colonoscopy to the terminal ileum   . DILATION AND CURETTAGE OF UTERUS  2013  . DILATION AND EVACUATION  02/02/2012   Procedure: DILATATION AND EVACUATION;  Surgeon: Levi Aland, MD;  Location: [facility];  Service:  Gynecology;  Laterality: N/A;  . ESOPHAGOGASTRODUODENOSCOPY  04/14/2017   Dr. Jennye BoroughsMisenheimer. Normal upper endoscopy. Empiric esophageal dilation to [address]    reports that she has been smoking cigarettes. She has a 16.00 pack-year smoking history. She has never used smokeless tobacco. She reports current drug use. Drug: Marijuana. She reports that she does not drink alcohol. family history includes Breast cancer in her paternal grandmother; Cancer in her maternal aunt and maternal grandmother; Heart disease in her father; Hypertension in her maternal grandmother; Vision loss in her maternal grandmother. Allergies  Allergen Reactions  . Amoxicillin-Pot  Clavulanate Nausea And Vomiting and Other (See Comments)  . Sulfa Antibiotics Nausea And Vomiting and Other (See Comments)    Other reaction(s): Other (See Comments)  . Sulfamethoxazole-Trimethoprim Nausea And Vomiting and Other (See Comments)      Outpatient Encounter Medications as of 09/24/2020  Medication Sig  . albuterol (VENTOLIN HFA) 108 (90 Base) MCG/ACT inhaler Inhale 1 puff into the lungs daily. Can do up to 4 times daily  . AMBULATORY NON FORMULARY MEDICATION as directed. CPAP machine  . amitriptyline (ELAVIL) 75 MG tablet Take 75 mg by mouth at bedtime.  Marland Kitchen. amLODipine (NORVASC) 5 MG tablet Take 5 mg by mouth daily.  Marland Kitchen. ammonium lactate (AMLACTIN) 12 % cream Apply topically 2 (two) times daily.  Marland Kitchen. amphetamine-dextroamphetamine (ADDERALL) 20 MG tablet Take 20 mg by mouth 3 (three) times daily.  . busPIRone (BUSPAR) 15 MG tablet 1 tablet 3 (three) times daily.  . chlorhexidine (PERIDEX) 0.12 % solution chlorhexidine gluconate 0.12 % mouthwash  PLEASE SEE ATTACHED FOR DETAILED DIRECTIONS  . fluticasone (FLONASE) 50 MCG/ACT nasal spray Place into the nose.  . fluticasone furoate-vilanterol (BREO ELLIPTA) 200-25 MCG/INH AEPB Inhale into the lungs.  . Fluticasone-Salmeterol (ADVAIR) 250-50 MCG/DOSE AEPB Advair Diskus 250 mcg-50 mcg/dose powder for inhalation  INHALE 1 PUFF BY MOUTH TWICE A DAY  . Fluticasone-Umeclidin-Vilant (TRELEGY ELLIPTA) 100-62.5-25 MCG/INH AEPB Inhale 1 puff into the lungs daily.  . furosemide (LASIX) 40 MG tablet furosemide 40 mg tablet  TAKE 1 TABLET BY MOUTH EVERY DAY  . Galcanezumab-gnlm (EMGALITY) 120 MG/ML SOAJ Emgality Pen 120 mg/mL subcutaneous pen injector  INJECT 1 ML ONCE A MONTH  . HYDROcodone-acetaminophen (NORCO/VICODIN) 5-325 MG tablet hydrocodone 5 mg-acetaminophen 325 mg tablet  TAKE 1 TABLET BY MOUTH EVERY 6 HOURS AS NEEDED FOR 5 DAYS  . hydrocortisone (ANUSOL-HC) 2.5 % rectal cream SMARTSIG:Sparingly Topical 2-4 Times Daily PRN  .  hydrocortisone 2.5 % cream hydrocortisone 2.5 % topical cream with perineal applicator  APPLY SPARINGLY TO AFFECTED AREA 2 TO 4 TIMES A DAY  . hyoscyamine (LEVSIN SL) 0.125 MG SL tablet TAKE ONE TABLET EVERY 4-6 HOURS AS NEEDED.  Marland Kitchen. ipratropium-albuterol (DUONEB) 0.5-2.5 (3) MG/3ML SOLN Take 3 mLs by nebulization as directed.  Marland Kitchen. levofloxacin (LEVAQUIN) 250 MG tablet Take 250 mg by mouth daily.  Marland Kitchen. levothyroxine (SYNTHROID) 100 MCG tablet 1 tablet daily.  Marland Kitchen. lidocaine (XYLOCAINE) 5 % ointment lidocaine 5 % topical ointment  1 APPLICATION AS NEEDED THREE TIMES A DAY EXTERNALLY 30 DAYS  . losartan (COZAAR) 25 MG tablet Take 25 mg by mouth daily.  . Magnesium Hydroxide (MILK OF MAGNESIA PO) Take by mouth daily. 4 tablespoons daily  . meloxicam (MOBIC) 7.5 MG tablet Take 7.5 mg by mouth daily.  . methylphenidate (RITALIN) 20 MG tablet 1 tablet 3 (three) times daily.  . montelukast (SINGULAIR) 10 MG tablet Take 10 mg by mouth daily.  . nitrofurantoin, macrocrystal-monohydrate, (MACROBID) 100 MG  capsule Take 100 mg by mouth 2 (two) times daily.  Marland Kitchen omeprazole (PRILOSEC) 20 MG capsule omeprazole 20 mg capsule,delayed release  TAKE 1 CAPSULE BY MOUTH EVERY DAY  . ondansetron (ZOFRAN) 8 MG tablet Take by mouth.  . ondansetron (ZOFRAN-ODT) 8 MG disintegrating tablet Take 1 tablet by mouth daily.   Marland Kitchen oxyCODONE-acetaminophen (PERCOCET/ROXICET) 5-325 MG tablet oxycodone-acetaminophen 5 mg-325 mg tablet  TAKE 1 TABLET BY MOUTH EVERY 4 HOURS  . pantoprazole (PROTONIX) 40 MG tablet Take 1 tablet (40 mg total) by mouth daily. (Patient taking differently: Take 40 mg by mouth 2 (two) times daily.)  . phenazopyridine (PYRIDIUM) 100 MG tablet Take 200 mg by mouth 3 (three) times daily.  . pregabalin (LYRICA) 100 MG capsule Take 100 mg by mouth 3 (three) times daily.  Marland Kitchen PROAIR HFA 108 (90 Base) MCG/ACT inhaler Inhale 2 puffs into the lungs every 4 (four) hours as needed.   . promethazine (PHENERGAN) 25 MG tablet  promethazine 25 mg tablet  TAKE 1 TABLET BY MOUTH EVERY 8 HOURS AS NEEDED FOR NAUSEA/VOMITING  . propranolol (INDERAL) 20 MG tablet Take 1 tablet (20 mg total) by mouth 2 (two) times daily.  . rizatriptan (MAXALT) 10 MG tablet Take by mouth.  . spironolactone (ALDACTONE) 100 MG tablet Take 100 mg by mouth daily.  . sucralfate (CARAFATE) 1 g tablet Take 1 g by mouth 3 (three) times daily.  Marland Kitchen sulfamethoxazole-trimethoprim (BACTRIM DS) 800-160 MG tablet Take 1 tablet by mouth 2 (two) times daily.  Marland Kitchen terconazole (TERAZOL 3) 0.8 % vaginal cream SMARTSIG:1 Applicator Vaginal Daily  . Tiotropium Bromide Monohydrate (SPIRIVA RESPIMAT) 1.25 MCG/ACT AERS Spiriva Respimat 1.25 mcg/actuation solution for inhalation  INHALE 2 PUFFS ONCE DAILY  . topiramate (TOPAMAX) 50 MG tablet Take 50 mg by mouth 2 (two) times daily.  . traMADol (ULTRAM) 50 MG tablet Take 50 mg by mouth daily.   Marland Kitchen triamcinolone (KENALOG) 0.1 % SMARTSIG:1 Application Topical 2-3 Times Daily  . valACYclovir (VALTREX) 500 MG tablet 1 tablet daily.   Marland Kitchen venlafaxine XR (EFFEXOR-XR) 150 MG 24 hr capsule Take 150 mg by mouth daily.  . [DISCONTINUED] cefUROXime (CEFTIN) 500 MG tablet cefuroxime axetil 500 mg tablet  TAKE 1 TABLET BY MOUTH EVERY 12 HOURS FOR 5 DAYS  . [DISCONTINUED] ciprofloxacin (CIPRO) 500 MG tablet ciprofloxacin 500 mg tablet  TAKE 1 TABLET BY MOUTH TWICE A DAY  . [DISCONTINUED] clindamycin (CLEOCIN) 300 MG capsule Take by mouth.  . [DISCONTINUED] metroNIDAZOLE (FLAGYL) 500 MG tablet Take 1 tablet by mouth 3 (three) times daily.   No facility-administered encounter medications on file as of 09/24/2020.     REVIEW OF SYSTEMS  : All other systems reviewed and negative except where noted in the History of Present Illness.   PHYSICAL EXAM: BP 120/84   Pulse 85   Ht 5\' 1"  (1.549 m)   Wt 161 lb (73 kg)   BMI 30.42 kg/m  General: Well developed white female in no acute distress Head: Normocephalic and atraumatic Eyes:   Sclerae anicteric,conjunctiva pink. Ears: Normal auditory acuity Lungs: Clear throughout to auscultation; no W/R/R. Heart: Regular rate and rhythm; no M/R/G. Abdomen: Soft, non-distended.  BS present.  Upper abdominal TTP. Musculoskeletal: Symmetrical with no gross deformities  Skin: No lesions on visible extremities Extremities: No edema  Neurological: Alert oriented x 4, grossly non-focal Psychological:  Alert and cooperative. Normal mood and affect  ASSESSMENT AND PLAN: *Nausea, vomiting, abdominal pain, GERD: CT scan unremarkable.  She has stopped all of  her medications.  We will plan for EGD with Dr. Chales Abrahams.  I have encouraged her to restart her PPI so she will go back on at least once, maybe twice daily.  The risks, benefits, and alternatives to EGD were discussed with the patient and she consents to proceed.   CC:  Hague, Myrene Galas, MD

## 2020-10-07 ENCOUNTER — Telehealth: Payer: Self-pay | Admitting: Gastroenterology

## 2020-10-07 ENCOUNTER — Other Ambulatory Visit: Payer: Self-pay | Admitting: Internal Medicine

## 2020-10-07 DIAGNOSIS — M952 Other acquired deformity of head: Secondary | ICD-10-CM

## 2020-10-07 DIAGNOSIS — R519 Headache, unspecified: Secondary | ICD-10-CM

## 2020-10-07 NOTE — Telephone Encounter (Signed)
Left message on machine to call back  

## 2020-10-07 NOTE — Telephone Encounter (Signed)
Line busy will try later

## 2020-10-07 NOTE — Telephone Encounter (Signed)
Pt states that she has been having IBS flare up for about a week now. She is asking for something to take. Pls call her.

## 2020-10-08 NOTE — Telephone Encounter (Signed)
Patient is returning your call.  

## 2020-10-08 NOTE — Telephone Encounter (Signed)
Left message on machine to call back  

## 2020-10-08 NOTE — Telephone Encounter (Signed)
The pt has been given the information per Shanda Bumps.  She will proceed with the EGD and wait for recommendations from Dr Chales Abrahams.

## 2020-10-08 NOTE — Telephone Encounter (Signed)
The pt states she has been having left side abd pain and went to her GYN because she thought it was ovarian pain.  She was told that it is not a GYN issue.  Her GYN states it may be IBS.  The pt is scheduled for an EGD and would like to know if she should have a colon as well. Her last colon was in 2020.  She was last seen in our office on 09/24/20.  Takes Levsin 1 every 4-6 hours as needed.  She had a prescription from 2021.  She does not think it helps.  Please advise

## 2020-10-08 NOTE — Telephone Encounter (Signed)
She is 35 years old and just had a completely normal colonoscopy less than 2 years ago.  She does not need another colonoscopy.  Await results of EGD and further recommendations from Dr. Chales Abrahams.

## 2020-10-16 ENCOUNTER — Other Ambulatory Visit: Payer: Self-pay

## 2020-10-19 ENCOUNTER — Ambulatory Visit: Payer: Medicaid Other | Admitting: Cardiology

## 2020-10-21 ENCOUNTER — Encounter: Payer: Self-pay | Admitting: Gastroenterology

## 2020-10-22 ENCOUNTER — Encounter: Payer: Self-pay | Admitting: Gastroenterology

## 2020-10-22 ENCOUNTER — Other Ambulatory Visit: Payer: Self-pay | Admitting: Gastroenterology

## 2020-10-22 ENCOUNTER — Ambulatory Visit (AMBULATORY_SURGERY_CENTER): Payer: Medicaid Other | Admitting: Gastroenterology

## 2020-10-22 ENCOUNTER — Other Ambulatory Visit: Payer: Self-pay

## 2020-10-22 VITALS — BP 117/67 | HR 65 | Temp 96.7°F | Resp 15 | Ht 61.0 in | Wt 161.0 lb

## 2020-10-22 DIAGNOSIS — K2970 Gastritis, unspecified, without bleeding: Secondary | ICD-10-CM | POA: Diagnosis not present

## 2020-10-22 DIAGNOSIS — K3189 Other diseases of stomach and duodenum: Secondary | ICD-10-CM | POA: Diagnosis not present

## 2020-10-22 DIAGNOSIS — R1310 Dysphagia, unspecified: Secondary | ICD-10-CM

## 2020-10-22 DIAGNOSIS — K219 Gastro-esophageal reflux disease without esophagitis: Secondary | ICD-10-CM

## 2020-10-22 DIAGNOSIS — K2289 Other specified disease of esophagus: Secondary | ICD-10-CM | POA: Diagnosis not present

## 2020-10-22 MED ORDER — SODIUM CHLORIDE 0.9 % IV SOLN
500.0000 mL | Freq: Once | INTRAVENOUS | Status: AC
Start: 2020-10-22 — End: ?

## 2020-10-22 NOTE — Progress Notes (Signed)
VS taken by [place] 

## 2020-10-22 NOTE — Progress Notes (Signed)
Report to PACU, RN, vss, BBS= Clear.  

## 2020-10-22 NOTE — Progress Notes (Signed)
Called to room to assist during endoscopic procedure.  Patient ID and intended procedure confirmed with present staff. Received instructions for my participation in the procedure from the performing physician.  

## 2020-10-22 NOTE — Op Note (Signed)
[facility] Patient Name: Amber [name] Procedure Date: 10/22/2020 2:50 PM MRN: [id number] Endoscopist: Lynann Bologna , MD Age: 35 Referring MD:  Date of Birth: [birth year] Gender: Female Account #: [id number] Procedure:                Upper GI endoscopy Indications:              Epigastric abdominal pain, Dysphagia, N/V Medicines:                Monitored Anesthesia Care Procedure:                Pre-Anesthesia Assessment:                           - Prior to the procedure, a History and Physical                            was performed, and patient medications and                            allergies were reviewed. The patient's tolerance of                            previous anesthesia was also reviewed. The risks                            and benefits of the procedure and the sedation                            options and risks were discussed with the patient.                            All questions were answered, and informed consent                            was obtained. Prior Anticoagulants: The patient has                            taken no previous anticoagulant or antiplatelet                            agents. ASA Grade Assessment: II - A patient with                            mild systemic disease. After reviewing the risks                            and benefits, the patient was deemed in                            satisfactory condition to undergo the procedure.                           After obtaining informed consent, the endoscope was  passed under direct vision. Throughout the                            procedure, the patient's blood pressure, pulse, and                            oxygen saturations were monitored continuously. The                            Endoscope was introduced through the mouth, and                            advanced to the second part of duodenum. The upper                            GI endoscopy was  accomplished without difficulty.                            The patient tolerated the procedure well. Scope In: Scope Out: Findings:                 The examined esophagus was normal with well-defined                            Z-line at 35 cm, examined by NBI. Biopsies were                            obtained from the proximal and distal esophagus                            with cold forceps for histology of suspected                            eosinophilic esophagitis. The scope was withdrawn.                            Dilation was performed with a Maloney dilator with                            mild resistance at 50 Fr.                           Localized mild inflammation characterized by                            erythema was found in the gastric antrum. Biopsies                            were taken with a cold forceps for histology.                           The examined duodenum was normal. Biopsies for  histology were taken with a cold forceps for                            evaluation of celiac disease. Complications:            No immediate complications. Estimated Blood Loss:     Estimated blood loss: none. Impression:               -Mild gastritis.                           -S/P esophageal dilatation. Recommendation:           - Patient has a contact number available for                            emergencies. The signs and symptoms of potential                            delayed complications were discussed with the                            patient. Return to normal activities tomorrow.                            Written discharge instructions were provided to the                            patient.                           - Post dilatation diet.                           - Continue present medications.                           - Await pathology results.                           - No aspirin, ibuprofen, naproxen, or other                             non-steroidal anti-inflammatory drugs.                           - The findings and recommendations were discussed                            with the patient's family. Lynann Bologna, MD 10/22/2020 3:10:58 PM This report has been signed electronically.

## 2020-10-22 NOTE — Patient Instructions (Signed)
Handout given :  Post dilation diet Continue current medications Await pathology results NO aspirin, ibuprofen, naproxen or any other NSAID's  YOU HAD AN ENDOSCOPIC PROCEDURE TODAY AT THE [facility]:   Refer to the procedure report that was given to you for any specific questions about what was found during the examination.  If the procedure report does not answer your questions, please call your gastroenterologist to clarify.  If you requested that your care partner not be given the details of your procedure findings, then the procedure report has been included in a sealed envelope for you to review at your convenience later.  YOU SHOULD EXPECT: Some feelings of bloating in the abdomen. Passage of more gas than usual.  Walking can help get rid of the air that was put into your GI tract during the procedure and reduce the bloating. If you had a lower endoscopy (such as a colonoscopy or flexible sigmoidoscopy) you may notice spotting of blood in your stool or on the toilet paper. If you underwent a bowel prep for your procedure, you may not have a normal bowel movement for a few days.  Please Note:  You might notice some irritation and congestion in your nose or some drainage.  This is from the oxygen used during your procedure.  There is no need for concern and it should clear up in a day or so.  SYMPTOMS TO REPORT IMMEDIATELY:   Following upper endoscopy (EGD)  Vomiting of blood or coffee ground material  New chest pain or pain under the shoulder blades  Painful or persistently difficult swallowing  New shortness of breath  Fever of 100F or higher  Black, tarry-looking stools  For urgent or emergent issues, a gastroenterologist can be reached at any hour by calling [phone]. Do not use MyChart messaging for urgent concerns.   DIET:  We do recommend a small meal at first, but then you may proceed to your regular diet.  Drink plenty of fluids but you should avoid  alcoholic beverages for 24 hours.  ACTIVITY:  You should plan to take it easy for the rest of today and you should NOT DRIVE or use heavy machinery until tomorrow (because of the sedation medicines used during the test).    FOLLOW UP: Our staff will call the number listed on your records 48-72 hours following your procedure to check on you and address any questions or concerns that you may have regarding the information given to you following your procedure. If we do not reach you, we will leave a message.  We will attempt to reach you two times.  During this call, we will ask if you have developed any symptoms of COVID 19. If you develop any symptoms (ie: fever, flu-like symptoms, shortness of breath, cough etc.) before then, please call [phone].  If you test positive for Covid 19 in the 2 weeks post procedure, please call and report this information to [place].    If any biopsies were taken you will be contacted by phone or by letter within the next 1-3 weeks.  Please call us at [phone] if you have not heard about the biopsies in 3 weeks.   SIGNATURES/CONFIDENTIALITY: You and/or your care partner have signed paperwork which will be entered into your electronic medical record.  These signatures attest to the fact that that the information above on your After Visit Summary has been reviewed and is understood.  Full responsibility of the confidentiality of this discharge information  lies with you and/or your care-partner.

## 2020-10-26 ENCOUNTER — Telehealth: Payer: Self-pay | Admitting: *Deleted

## 2020-10-26 NOTE — Telephone Encounter (Signed)
  Follow up Call-  Call back number 10/22/2020 01/04/2019  Post procedure Call Back phone  # [phone]  Permission to leave phone message Yes Yes  Some recent data might be hidden     Patient questions:  Message left to call if necessary. Second call.

## 2020-10-26 NOTE — Telephone Encounter (Signed)
  Follow up Call-  Call back number 10/22/2020 01/04/2019  Post procedure Call Back phone  # [phone]  Permission to leave phone message Yes Yes  Some recent data might be hidden     Patient questions:  Message left to call us if necessary.

## 2020-10-26 NOTE — Progress Notes (Signed)
Agree with note RG 

## 2020-10-27 ENCOUNTER — Ambulatory Visit
Admission: RE | Admit: 2020-10-27 | Discharge: 2020-10-27 | Disposition: A | Discharge status: Home / Self Care | Payer: Medicaid Other | Source: Ambulatory Visit | Attending: Internal Medicine | Admitting: Internal Medicine

## 2020-10-27 DIAGNOSIS — R519 Headache, unspecified: Secondary | ICD-10-CM

## 2020-10-27 DIAGNOSIS — M952 Other acquired deformity of head: Secondary | ICD-10-CM

## 2020-10-30 ENCOUNTER — Ambulatory Visit: Payer: Medicaid Other | Admitting: Cardiology

## 2020-11-12 ENCOUNTER — Encounter: Payer: Self-pay | Admitting: Gastroenterology

## 2020-11-13 ENCOUNTER — Other Ambulatory Visit: Payer: Self-pay

## 2020-11-13 DIAGNOSIS — T7840XA Allergy, unspecified, initial encounter: Secondary | ICD-10-CM | POA: Insufficient documentation

## 2020-11-13 DIAGNOSIS — R110 Nausea: Secondary | ICD-10-CM | POA: Insufficient documentation

## 2020-11-13 DIAGNOSIS — R011 Cardiac murmur, unspecified: Secondary | ICD-10-CM | POA: Insufficient documentation

## 2020-11-17 ENCOUNTER — Encounter: Payer: Self-pay | Admitting: Cardiology

## 2020-11-17 ENCOUNTER — Ambulatory Visit (INDEPENDENT_AMBULATORY_CARE_PROVIDER_SITE_OTHER): Payer: Medicaid Other | Admitting: Cardiology

## 2020-11-17 ENCOUNTER — Other Ambulatory Visit: Payer: Self-pay

## 2020-11-17 VITALS — BP 112/68 | HR 83 | Ht 61.0 in | Wt 151.2 lb

## 2020-11-17 DIAGNOSIS — I10 Essential (primary) hypertension: Secondary | ICD-10-CM | POA: Diagnosis not present

## 2020-11-17 DIAGNOSIS — R002 Palpitations: Secondary | ICD-10-CM

## 2020-11-17 DIAGNOSIS — Z720 Tobacco use: Secondary | ICD-10-CM | POA: Diagnosis not present

## 2020-11-17 MED ORDER — PROPRANOLOL HCL 20 MG PO TABS: 20.0000 mg | ORAL_TABLET | Freq: Two times a day (BID) | ORAL | 3 refills | Status: DC

## 2020-11-17 NOTE — Patient Instructions (Signed)
Medication Instructions:  Your physician has recommended you make the following change in your medication:  RESTART: Propranolol 20 mg twice daily *If you need a refill on your cardiac medications before your next appointment, please call your pharmacy*   Lab Work: None If you have labs (blood work) drawn today and your tests are completely normal, you will receive your results only by: Marland Kitchen MyChart Message (if you have MyChart) OR . A paper copy in the mail If you have any lab test that is abnormal or we need to change your treatment, we will call you to review the results.   Testing/Procedures: None   Follow-Up: At [facility], you and your health needs are our priority.  As part of our continuing mission to provide you with exceptional heart care, we have created designated Provider Care Teams.  These Care Teams include your primary Cardiologist (physician) and Advanced Practice Providers (APPs -  Physician Assistants and Nurse Practitioners) who all work together to provide you with the care you need, when you need it.  We recommend signing up for the patient portal called "MyChart".  Sign up information is provided on this After Visit Summary.  MyChart is used to connect with patients for Virtual Visits (Telemedicine).  Patients are able to view lab/test results, encounter notes, upcoming appointments, etc.  Non-urgent messages can be sent to your provider as well.   To learn more about what you can do with MyChart, go to [email or web address].    Your next appointment:   1 year(s)  The format for your next appointment:   In Person  Provider:   Thomasene Ripple, DO   Other Instructions

## 2020-11-17 NOTE — Progress Notes (Signed)
Cardiology Office Note:    Date:  11/17/2020   ID:  Amber [name], DOB 1986-02-15, MRN [id number]  PCP:  Galvin Proffer, MD  Cardiologist:  Thomasene Ripple, DO  Electrophysiologist:  None   Referring MD: Galvin Proffer, MD   I am still having palpitations  History of Present Illness:    Amber [name] is a 35 y.o. female with a hx of hypertension diagnosed 1 year ago, diabetes type 2, hyperlipidemia,raynaud'ssyndrome, anxiety disorder presents today to be evaluated for chest pain, and palpitations.  During her initial visit I was able to review her monitor report with her which was performed in July 2020 that showed that her heart rate went as high as 160 otherwise no evidence of PACs, PVCs or any atrial arrhythmias. At the time I did educate the patient that her sinus tachycardia was secondary likely to her anxiety. For the complaint shortness of breath echocardiogram was ordered and a CTA due to family history and her other medical conditions.  I saw the patient on 07/03/2019 at that visit we discussed her TTE and her CCTA results. At that time she also did tell me that she wastaken of Lasix and was now taking Aldactone. She is still short of breat at times.  At her last visit on October 01, 2019 we discussed her testing result..  I saw the patient on July 22, 2020 via televisit, stop the metoprolol and start the patient on propanolol 20 mg twice a day.  She is here today for follow-up visit. Since I last saw the patient she tells me she got ill and stopped all of her medications.  She notes that prior to this the propanolol was helping her.  She does note that she stopped the medication she has had significant palpitations.  Past Medical History:  Diagnosis Date  . Acute pharyngitis, unspecified   . Allergic rhinitis   . Allergy   . Anemia   . Anxiety   . Arthritis   . Asthma 04/14/2016  . Chronic fatigue   . Chronic nausea   . Clotting disorder (HCC)   . Colitis    . COPD (chronic obstructive pulmonary disease) (HCC)   . Cystitis, unspecified without hematuria   . Depression   . Dizziness 08/24/2017  . Dyspnea   . Fibromyalgia   . Fluid retention   . GERD (gastroesophageal reflux disease)   . Heart murmur   . Herpes simplex type 2 infection   . History of Clostridioides difficile colitis   . HPV (human papilloma virus) infection   . Hypertensive disorder 02/16/2018  . Hyperthyroidism   . Hypothyroidism 04/03/2019  . IBS (irritable bowel syndrome)   . 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 Migraine headache   . Mild persistent asthma, uncomplicated   . Narcolepsy 2019  . Obstructive sleep apnea on CPAP 08/24/2017  . Palpitations   . Raynaud's disease without gangrene 04/03/2019  . Raynaud's syndrome without gangrene   . [facility] spotted fever) 08/24/2017  . Sleep apnea    wears CPAP  . Tachycardia   . Tick-borne relapsing fever   . Tobacco abuse 08/24/2017  . Tremor   . Trigger thumb    and inflammation    Past Surgical History:  Procedure Laterality Date  . APPENDECTOMY    . COLONOSCOPY  04/14/2017   Dr Jennye Boroughs. Normal colonoscopy to the terminal ileum   . DILATION AND CURETTAGE OF UTERUS  2013  . DILATION AND EVACUATION  02/02/2012   Procedure: DILATATION AND EVACUATION;  Surgeon: Levi Aland, MD;  Location: [facility];  Service: Gynecology;  Laterality: N/A;  . ESOPHAGOGASTRODUODENOSCOPY  04/14/2017   Dr. Jennye Boroughs. Normal upper endoscopy. Empiric esophageal dilation to [address] [place]    Current Medications: No outpatient medications have been marked as taking for the 11/17/20 encounter (Office Visit) with Thomasene Ripple, DO.   Current Facility-Administered Medications for the 11/17/20 encounter (Office Visit) with Thomasene Ripple, DO  Medication  . 0.9 %  sodium chloride infusion     Allergies:   Amoxicillin-pot clavulanate, Sulfa antibiotics, and Sulfamethoxazole-trimethoprim   Social History   Socioeconomic  History  . Marital status: Single    Spouse name: Not on file  . Number of children: Not on file  . Years of education: Not on file  . Highest education level: Not on file  Occupational History  . Not on file  Tobacco Use  . Smoking status: Current Every Day Smoker    Packs/day: 1.00    Years: 16.00    Pack years: 16.00    Types: Cigarettes  . Smokeless tobacco: Never Used  Vaping Use  . Vaping Use: Former  Substance and Sexual Activity  . Alcohol use: No  . Drug use: Yes    Types: Marijuana  . Sexual activity: Yes  Other Topics Concern  . Not on file  Social History Narrative  . Not on file   Social Determinants of Health   Financial Resource Strain: Not on file  Food Insecurity: Not on file  Transportation Needs: Not on file  Physical Activity: Not on file  Stress: Not on file  Social Connections: Not on file     Family History: The patient's family history includes Breast cancer in her paternal grandmother; Cancer in her maternal aunt and maternal grandmother; Colon cancer in an other family member; Heart disease in her father; Hypertension in her maternal grandmother; Vision loss in her maternal grandmother. There is no history of Stomach cancer, Rectal cancer, or Esophageal cancer.  ROS:   Review of Systems  Constitution: Negative for decreased appetite, fever and weight gain.  HENT: Negative for congestion, ear discharge, hoarse voice and sore throat.   Eyes: Negative for discharge, redness, vision loss in right eye and visual halos.  Cardiovascular: Negative for chest pain, dyspnea on exertion, leg swelling, orthopnea and palpitations.  Respiratory: Negative for cough, hemoptysis, shortness of breath and snoring.   Endocrine: Negative for heat intolerance and polyphagia.  Hematologic/Lymphatic: Negative for bleeding problem. Does not bruise/bleed easily.  Skin: Negative for flushing, nail changes, rash and suspicious lesions.  Musculoskeletal: Negative for  arthritis, joint pain, muscle cramps, myalgias, neck pain and stiffness.  Gastrointestinal: Negative for abdominal pain, bowel incontinence, diarrhea and excessive appetite.  Genitourinary: Negative for decreased libido, genital sores and incomplete emptying.  Neurological: Negative for brief paralysis, focal weakness, headaches and loss of balance.  Psychiatric/Behavioral: Negative for altered mental status, depression and suicidal ideas.  Allergic/Immunologic: Negative for HIV exposure and persistent infections.    EKGs/Labs/Other Studies Reviewed:    The following studies were reviewed today:   EKG:  The ekg ordered today demonstrates Sinus rhythm, heart rate 83 bpm  Transthoracic echocardiogramIMPRESSIONS9/17/20 1. Left ventricular ejection fraction, by visual estimation, is 60 to 65%. The left ventricle has normal function. Normal left ventricular size. There is no left ventricular hypertrophy. 2. Global right ventricle has normal systolic function.The right ventricular size is normal. No increase in right ventricular wall thickness. 3.  Left atrial size was normal. 4. Right atrial size was normal. 5. The mitral valve is normal in structure. Trace mitral valve regurgitation. No evidence of mitral stenosis. 6. The tricuspid valve is normal in structure. Tricuspid valve regurgitation was not visualized by color flow Doppler. 7. The aortic valve is normal in structure. Aortic valve regurgitation was not visualized by color flow Doppler. Structurally normal aortic valve, with no evidence of sclerosis or stenosis. 8. The pulmonic valve was normal in structure. Pulmonic valve regurgitation is not visualized by color flow Doppler. 9. The inferior vena cava is normal in size with greater than 50% respiratory variability, suggesting right atrial pressure of 3 mmHg.  Coronary CTA FINDINGS:05/31/2019 Coronary calcium score: The patient's coronary artery calcium score is 0, which  places the patient in the 0 percentile.  Coronary arteries: Normal coronary origins. Right dominance.  Right Coronary Artery: Normal caliber vessel, gives rise to PDA. No significant plaque or stenosis.  Left Main Coronary Artery: Very short trunk, common ostium but then almost immediately bifurcates. No significant plaque or stenosis.  Left Anterior Descending Coronary Artery: Normal caliber vessel. No significant plaque or stenosis. Gives rise to 1 medium diagonal branch.  Left Circumflex Artery: Normal caliber vessel. No significant plaque or stenosis. Gives rise to 1 large OM branches.  Aorta: Normal size, 26 mm at the mid ascending aorta (level of the PA bifurcation) measured double oblique. No calcifications. No dissection.  Aortic Valve: No calcifications. Trileaflet.  Other findings:  Normal pulmonary vein drainage into the left atrium.  Normal left atrial appendage without a thrombus.  Normal size of the pulmonary artery.  IMPRESSION: 1. No evidence of CAD, CADRADS = 0.  2. Coronary calcium score of 0. This was 0 percentile for age and sex matched control.  3. Normal coronary origin with right dominance.  4. Overall small caliber vessels without any plaque or stenosis   Recent Labs: No results found for requested labs within last 8760 hours.  Recent Lipid Panel No results found for: CHOL, TRIG, HDL, CHOLHDL, VLDL, LDLCALC, LDLDIRECT  Physical Exam:    VS:  BP 112/68   Pulse 83   Ht 5\' 1"  (1.549 m)   Wt 151 lb 3.2 oz (68.6 kg)   SpO2 98%   BMI 28.57 kg/m     Wt Readings from Last 3 Encounters:  11/17/20 151 lb 3.2 oz (68.6 kg)  10/22/20 161 lb (73 kg)  09/24/20 161 lb (73 kg)     GEN: Well nourished, well developed in no acute distress HEENT: Normal NECK: No JVD; No carotid bruits LYMPHATICS: No lymphadenopathy CARDIAC: S1S2 noted,RRR, no murmurs, rubs, gallops RESPIRATORY:  Clear to auscultation without rales, wheezing or  rhonchi  ABDOMEN: Soft, non-tender, non-distended, +bowel sounds, no guarding. EXTREMITIES: No edema, No cyanosis, no clubbing MUSCULOSKELETAL:  No deformity  SKIN: Warm and dry NEUROLOGIC:  Alert and oriented x 3, non-focal PSYCHIATRIC:  Normal affect, good insight  ASSESSMENT:    1. Essential hypertension   2. Palpitations   3. Tobacco abuse    PLAN:     I spoke with the patient it would be in her best interest to restart the propanolol since it was helping her with her symptoms.  She does have underlying anxiety which is not being treated.  We again went over all of her testing results coronary CTA 0 calcium no evidence of coronary artery disease.  I explained to her that if the propanolol was working help with the palpitations I  would recommend she stay on this medication.  Smoking cessation advised.  The patient is in agreement with the above plan. The patient left the office in stable condition.  The patient will follow up in 1 year or sooner if needed.   Medication Adjustments/Labs and Tests Ordered: Current medicines are reviewed at length with the patient today.  Concerns regarding medicines are outlined above.  No orders of the defined types were placed in this encounter.  No orders of the defined types were placed in this encounter.   There are no Patient Instructions on file for this visit.   Adopting a Healthy Lifestyle.  Know what a healthy weight is for you (roughly BMI <25) and aim to maintain this   Aim for 7+ servings of fruits and vegetables daily   65-80+ fluid ounces of water or unsweet tea for healthy kidneys   Limit to max 1 drink of alcohol per day; avoid smoking/tobacco   Limit animal fats in diet for cholesterol and heart health - choose grass fed whenever available   Avoid highly processed foods, and foods high in saturated/trans fats   Aim for low stress - take time to unwind and care for your mental health   Aim for 150 min of moderate  intensity exercise weekly for heart health, and weights twice weekly for bone health   Aim for 7-9 hours of sleep daily   When it comes to diets, agreement about the perfect plan isnt easy to find, even among the experts. Experts at the [facility] [employer] developed an idea known as the Healthy Eating Plate. Just imagine a plate divided into logical, healthy portions.   The emphasis is on diet quality:   Load up on vegetables and fruits - one-half of your plate: Aim for color and variety, and remember that potatoes dont count.   Go for whole grains - one-quarter of your plate: Whole wheat, barley, wheat berries, quinoa, oats, brown rice, and foods made with them. If you want pasta, go with whole wheat pasta.   Protein power - one-quarter of your plate: Fish, chicken, beans, and nuts are all healthy, versatile protein sources. Limit red meat.   The diet, however, does go beyond the plate, offering a few other suggestions.   Use healthy plant oils, such as olive, canola, soy, corn, sunflower and peanut. Check the labels, and avoid partially hydrogenated oil, which have unhealthy trans fats.   If youre thirsty, drink water. Coffee and tea are good in moderation, but skip sugary drinks and limit milk and dairy products to one or two daily servings.   The type of carbohydrate in the diet is more important than the amount. Some sources of carbohydrates, such as vegetables, fruits, whole grains, and beans-are healthier than others.   Finally, stay active  Signed, Thomasene Ripple, DO  11/17/2020 2:58 PM    [facility]

## 2020-11-24 DIAGNOSIS — Z0289 Encounter for other administrative examinations: Secondary | ICD-10-CM | POA: Insufficient documentation

## 2021-02-23 DIAGNOSIS — M25551 Pain in right hip: Secondary | ICD-10-CM | POA: Insufficient documentation

## 2021-06-14 IMAGING — MR MR HEAD W/O CM
10 series · 48 of 48 positions shown · non-contrast
Comparison: Head CT 08/31/2018

CLINICAL DATA: Chronic headaches. Two bumps on the top of the head
for 2 months with fluctuation in size.

EXAM:
MRI HEAD WITHOUT CONTRAST
TECHNIQUE: Multiplanar, multiecho pulse sequences of the brain and surrounding
structures were obtained without intravenous contrast.

[Series 2: T1 · sagittal · 5.0mm · 0.45mm/px · 2 of 21 slices shown]
[im 1/21]
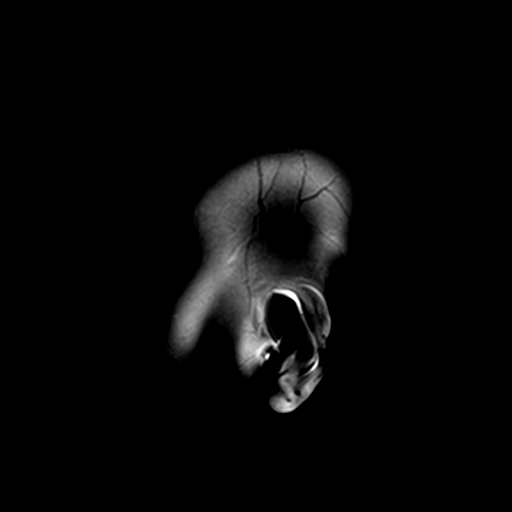
[im 21/21]
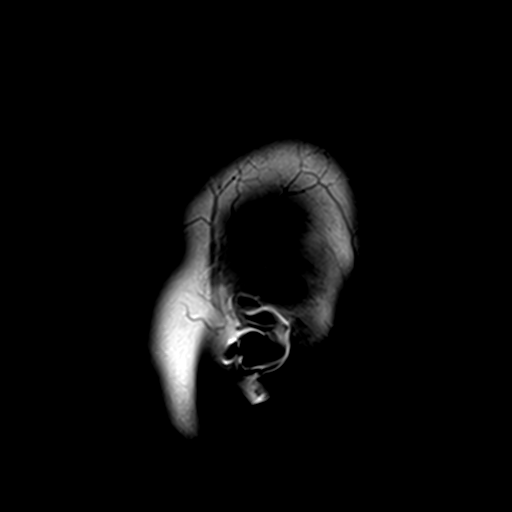

[Series 3: DWI · axial · 3.0mm · 1.80mm/px · z∈[-42,+104]mm · 9 of 99 slices shown (1 of 4)]
[im 1/99]
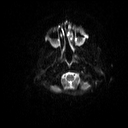
[im 13/99]
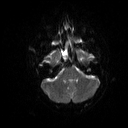
[im 25/99]
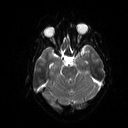
[im 37/99]
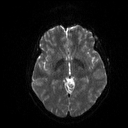
[im 50/99]
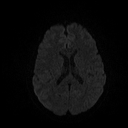
[im 62/99]
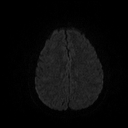
[im 74/99]
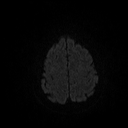
[im 86/99]
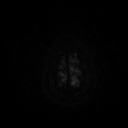
[im 99/99]
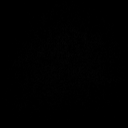

[Series 4: DWI · axial · 3.0mm · 1.80mm/px · z∈[-42,+98]mm · 4 of 48 slices shown (2 of 4)]
[im 1/48]
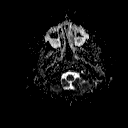
[im 16/48]
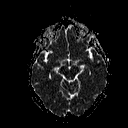
[im 32/48]
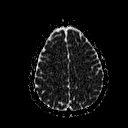
[im 48/48]
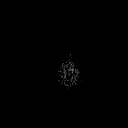

[Series 5: DWI · coronal · 5.0mm · 1.80mm/px · 6 of 68 slices shown (3 of 4)]
[im 1/68]
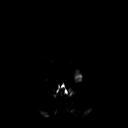
[im 14/68]
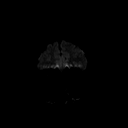
[im 27/68]
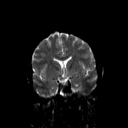
[im 41/68]
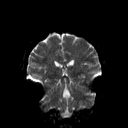
[im 54/68]
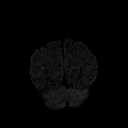
[im 68/68]
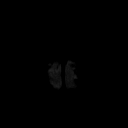

[Series 6: DWI · coronal · 5.0mm · 1.80mm/px · 3 of 34 slices shown (4 of 4)]
[im 1/34]
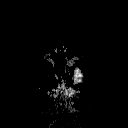
[im 17/34]
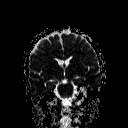
[im 34/34]
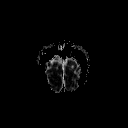

[Series 7: T2 · axial · 5.0mm · 0.51mm/px · z∈[-52,+109]mm · 2 of 24 slices shown (1 of 2)]
[im 1/24]
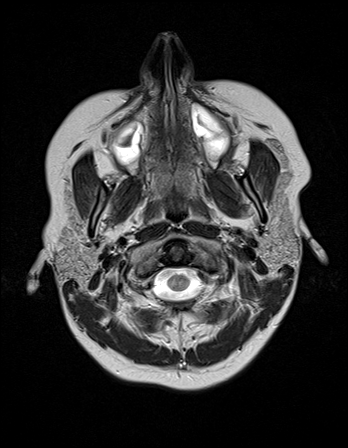
[im 24/24]
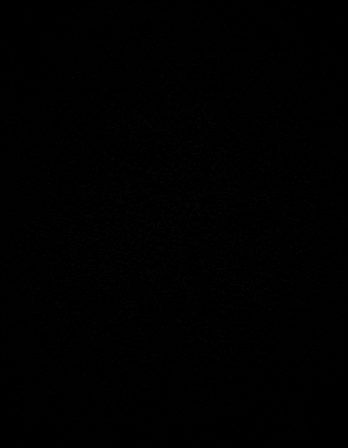

[Series 8: FLAIR · axial · 3.0mm · 0.45mm/px · z∈[-54,+109]mm · 3 of 36 slices shown]
[im 1/36]
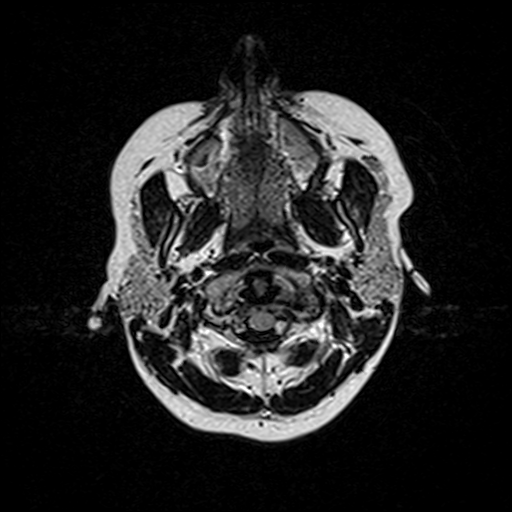
[im 18/36]
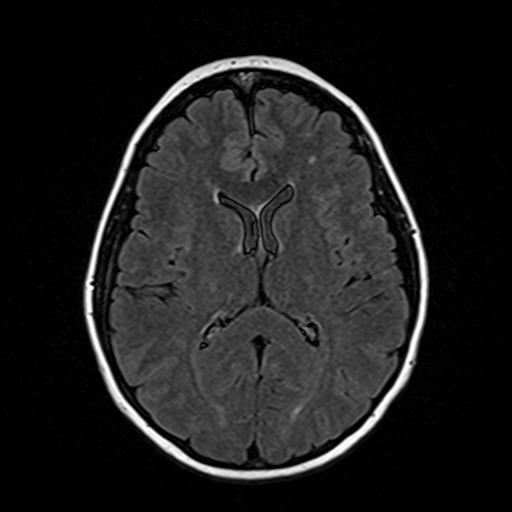
[im 36/36]
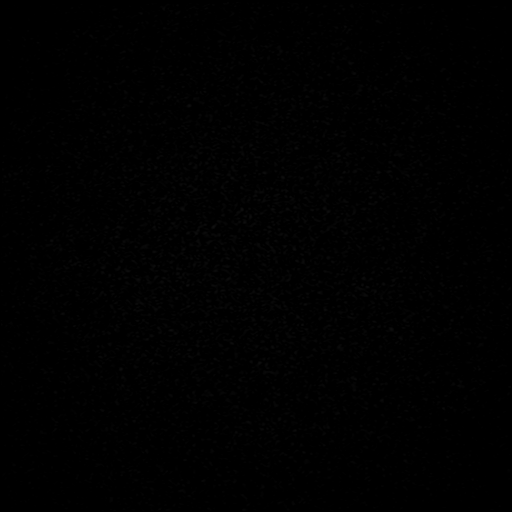

[Series 10: swi_images · axial · 4.0mm · 0.90mm/px · z∈[-50,+106]mm · 4 of 40 slices shown]
[im 1/40]
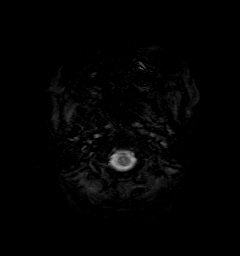
[im 14/40]
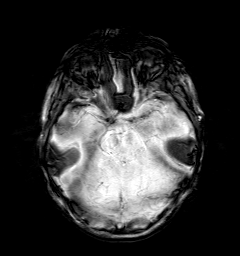
[im 27/40]
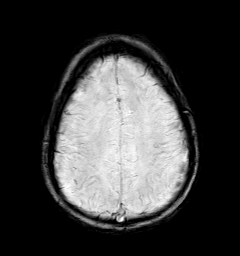
[im 40/40]
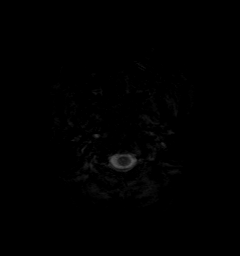

[Series 11: t1_mpr_tra · axial · 1.0mm · 0.71mm/px · z∈[-47,+96]mm · 13 of 144 slices shown]
[im 1/144]
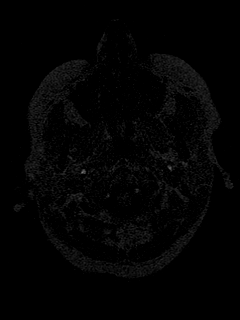
[im 12/144]
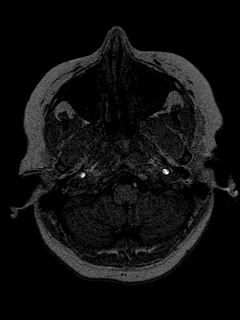
[im 24/144]
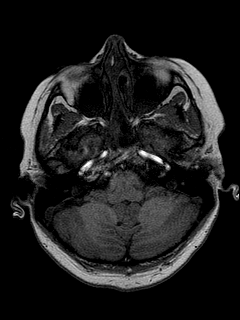
[im 36/144]
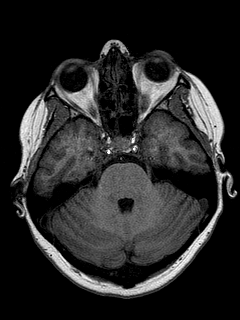
[im 48/144]
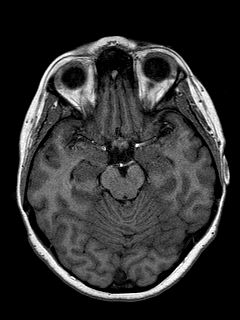
[im 60/144]
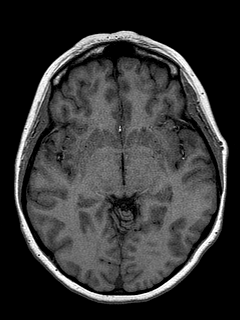
[im 72/144]
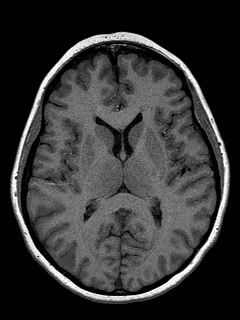
[im 84/144]
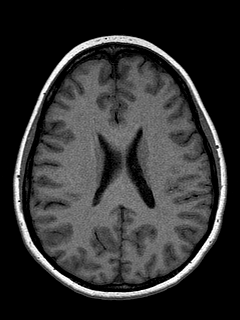
[im 96/144]
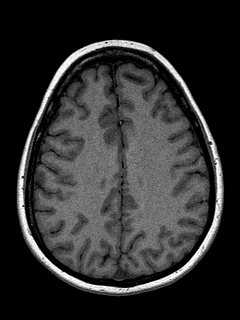
[im 108/144]
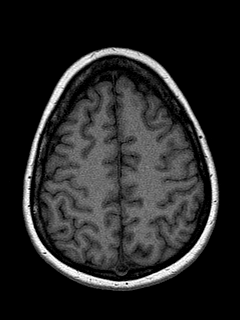
[im 120/144]
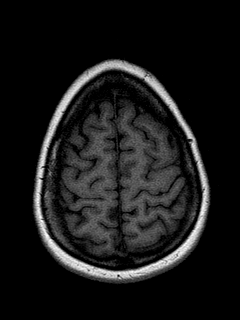
[im 132/144]
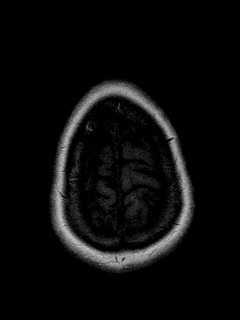
[im 144/144]
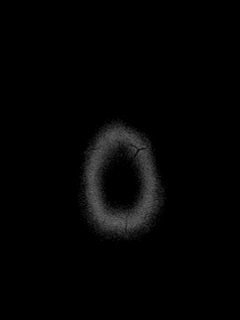

[Series 12: T2 · coronal · 5.0mm · 0.45mm/px · 2 of 28 slices shown (2 of 2)]
[im 1/28]
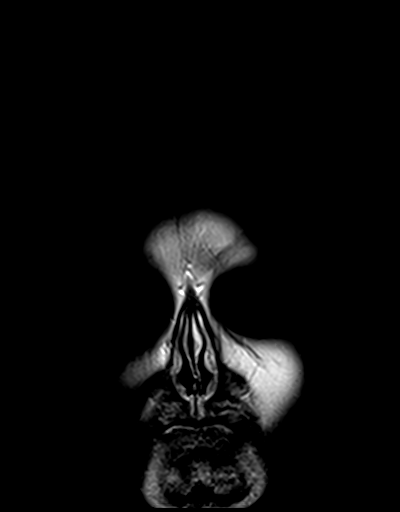
[im 28/28]
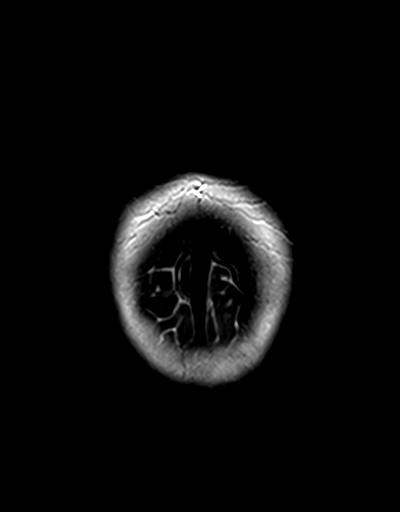

[48 of 48 positions shown; findings below may reference images not displayed]

FINDINGS: Brain: There is no evidence of an acute infarct, intracranial
hemorrhage, mass, midline shift, or extra-axial fluid collection.
The ventricles and sulci are normal. There are several punctate foci
of T2 FLAIR hyperintensity in the subcortical and deep white matter
of the left cerebral hemisphere. The right cerebral hemisphere,
brainstem, and cerebellum are normal in signal.

Vascular: Major intracranial vascular flow voids are preserved.

Skull and upper cervical spine: Unremarkable bone marrow signal. No
skull mass identified.

Sinuses/Orbits: Unremarkable orbits. Mild circumferential mucosal
thickening and small to moderate volume fluid in both maxillary
sinuses. Subtotal opacification of the right sphenoid sinus by
mucosal thickening and possible fluid. Mild left sphenoid and
bilateral ethmoid sinus mucosal thickening. Clear mastoid air cells.

Other: None.
IMPRESSION: 1. No acute intracranial abnormality.
2. Minimal T2 hyperintensity in the left cerebral hemisphere white
matter, nonspecific though may reflect migraines, early chronic
small vessel ischemia, or prior infection/inflammation.
3. Mucosal thickening and fluid in the paranasal sinuses, correlate
for acute sinusitis.

## 2021-09-07 DIAGNOSIS — Z7289 Other problems related to lifestyle: Secondary | ICD-10-CM | POA: Insufficient documentation

## 2022-01-10 DIAGNOSIS — R1031 Right lower quadrant pain: Secondary | ICD-10-CM | POA: Insufficient documentation

## 2022-07-05 ENCOUNTER — Encounter: Payer: Self-pay | Admitting: Cardiology

## 2022-07-05 ENCOUNTER — Encounter: Payer: Self-pay | Admitting: *Deleted

## 2022-07-05 ENCOUNTER — Ambulatory Visit: Payer: Medicaid Other | Attending: Physician Assistant | Admitting: Cardiology

## 2022-07-05 ENCOUNTER — Ambulatory Visit (INDEPENDENT_AMBULATORY_CARE_PROVIDER_SITE_OTHER): Payer: Medicaid Other

## 2022-07-05 VITALS — BP 138/78 | HR 91 | Ht 61.0 in | Wt 152.2 lb

## 2022-07-05 DIAGNOSIS — F1010 Alcohol abuse, uncomplicated: Secondary | ICD-10-CM | POA: Diagnosis not present

## 2022-07-05 DIAGNOSIS — F419 Anxiety disorder, unspecified: Secondary | ICD-10-CM

## 2022-07-05 DIAGNOSIS — R002 Palpitations: Secondary | ICD-10-CM | POA: Diagnosis not present

## 2022-07-05 DIAGNOSIS — I10 Essential (primary) hypertension: Secondary | ICD-10-CM

## 2022-07-05 DIAGNOSIS — R079 Chest pain, unspecified: Secondary | ICD-10-CM

## 2022-07-05 DIAGNOSIS — F17290 Nicotine dependence, other tobacco product, uncomplicated: Secondary | ICD-10-CM

## 2022-07-05 NOTE — Progress Notes (Unsigned)
Enrolled patient for a 14 day Zio XT monitor to be mailed to patients home   Dr Tobb to read 

## 2022-07-05 NOTE — Progress Notes (Signed)
Cardiology Office Note:    Date:  07/05/2022   ID:  Amber [name], DOB [birth year], MRN [id number]  PCP:  Galvin Proffer, MD   [facility] Providers Cardiologist:  Thomasene Ripple, DO     Referring MD: Galvin Proffer, MD   No chief complaint on file.  History of Present Illness:    Amber [name] is a 36 y.o. female with a hx of HTN, palpitations, and tobacco use.   She presents today with her grandmother with concerns of chest pain and palpitations. Since she was last in the office, she has stopped most of her medications around a year ago for concerns that she was taking too many medications. Her grandmother voiced that she would fall asleep with food in her mouth and her young daughter was having to take care of her (clean up food etc.). She also stopped her levothyroxine ~ 3 weeks ago, no clear reason as to why she stopped this.   She endorses extreme stress. She has been coping with this by vaping and drinking ETOH. She is drinking mostly hard liquor. We discussed the deleterious effects that vaping and drinking have on her health and that getting established with a PCP to manage her anxiety will be beneficial for her. For the last 1-2 months, she has had CP, palpitations, and worsening anxiety. She reports the CP is substernal and radiates over both breasts, heavy and sharp in nature, not precipitated by anything and not relieved with anything, pain usually lasts 30-60 minutes, 9/10. After pain subsides, she has nausea. Her palpitations occur when she is on her left or right side.   Past Medical History:  Diagnosis Date   Acute pharyngitis, unspecified    Allergic rhinitis    Allergy    Anemia    Anxiety    Arthritis    Asthma 04/14/2016   Chronic fatigue    Chronic nausea    Clotting disorder (HCC)    Colitis    COPD (chronic obstructive pulmonary disease) (HCC)    Cystitis, unspecified without hematuria    Depression    Dizziness 08/24/2017   Dyspnea     Fibromyalgia    Fluid retention    GERD (gastroesophageal reflux disease)    Heart murmur    Herpes simplex type 2 infection    History of Clostridioides difficile colitis    HPV (human papilloma virus) infection    Hypertensive disorder 02/16/2018   Hyperthyroidism    Hypothyroidism 04/03/2019   IBS (irritable bowel syndrome)    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 Normal colonoscopy to the terminal ileum    DILATION AND CURETTAGE OF UTERUS  2013   DILATION AND EVACUATION  02/02/2012   Procedure: DILATATION AND EVACUATION;  Surgeon: Levi Aland, MD;  Location: [facility];  Service: Gynecology;  Laterality: N/A;   ESOPHAGOGASTRODUODENOSCOPY  04/14/2017   Dr. Jennye Boroughs. Normal upper endoscopy. Empiric esophageal dilation to 50 [place]    Current Medications: Current Meds  Medication Sig   ALTAVERA 0.15-30 MG-MCG tablet Take 1 tablet by mouth daily.   ammonium lactate (AMLACTIN) 12 % cream Apply 1 Application topically as needed for dry skin.   Levothyroxine Sodium 50 MCG CAPS Take 50 mcg by mouth daily.   NUVIGIL 200 MG TABS Take 1 tablet by mouth daily.   SYMBICORT 160-4.5 MCG/ACT inhaler Inhale 2 puffs into the lungs daily.   valACYclovir (VALTREX) 500 MG tablet Take 500 mg by mouth daily.   VENTOLIN HFA 108 (90 Base) MCG/ACT inhaler Inhale 1-2 puffs into the lungs as needed for wheezing or shortness of breath.   Current  Facility-Administered Medications for the 07/05/22 encounter (Office Visit) with Flossie Dibble, NP  Medication   0.9 %  sodium chloride infusion     Allergies:   Amoxicillin-pot clavulanate, Sulfa antibiotics, and Sulfamethoxazole-trimethoprim   Social History   Socioeconomic History   Marital status: Single    Spouse name: Not on file   Number of children: Not on file   Years of education: Not on file   Highest education level: Not on file  Occupational History   Not on file  Tobacco Use   Smoking status: Every Day    Packs/day: 1.00    Years: 16.00    Total pack years: 16.00    Types: Cigarettes   Smokeless tobacco: Never  Vaping Use   Vaping Use: Former  Substance and Sexual Activity   Alcohol use: No   Drug use: Yes    Types: Marijuana   Sexual activity: Yes  Other Topics Concern   Not on file  Social History Narrative   Not on file   Social Determinants of Health   Financial Resource Strain: Not on file  Food Insecurity: Not on file  Transportation Needs: Not on file  Physical Activity: Not on file  Stress: Not on file  Social Connections: Not on file     Family History: The patient's family history includes Breast cancer in her paternal grandmother; Cancer in her maternal aunt and maternal grandmother; Colon cancer in an other family member; Heart disease in her father; Hypertension in her maternal grandmother; Vision loss in her maternal grandmother. There is no history of Stomach cancer, Rectal cancer, or Esophageal cancer.  ROS:   Review of Systems  Constitutional: Negative.   HENT: Negative.    Eyes: Negative.   Respiratory: Negative.    Cardiovascular:  Positive for chest pain and palpitations. Negative for orthopnea, claudication, leg swelling and PND.  Gastrointestinal: Negative.   Genitourinary: Negative.   Musculoskeletal: Negative.   Skin: Negative.   Neurological:  Negative for dizziness and loss of consciousness.  Endo/Heme/Allergies:  Negative.   Psychiatric/Behavioral:  The patient is nervous/anxious.      EKGs/Labs/Other Studies Reviewed:    The following studies were reviewed today:  05/31/19 Coronary CTA - Calcium score of 0, no evidence of CAD  04/04/19 Echo complete - LVEF 60-65%. Trace MVR.   02/20/19 Long term monitor - minimum HR 66, max 163, average HR 114. No PVCs, VT, or PACs.     EKG:  EKG is ordered today.  The ekg ordered today demonstrates NSR, consistent with previous tracings.   Recent Labs: No results found for requested labs within last 365 days.  Recent Lipid Panel No results found for: "CHOL", "TRIG", "HDL", "CHOLHDL", "VLDL", "LDLCALC", "LDLDIRECT"  Risk Assessment/Calculations:                Physical Exam:    VS:  BP 138/78   Pulse 91   Ht 5\' 1"  (1.549 m)   Wt 152 lb 3.2 oz (69 kg)   SpO2 98%   BMI 28.76 kg/m     Wt Readings from Last 3 Encounters:  07/05/22 152 lb 3.2 oz (69 kg)  11/17/20 151 lb 3.2 oz (68.6 kg)  10/22/20 161 lb (73 kg)     GEN:  Well nourished, well developed in no acute distress HEENT: Normal NECK: No JVD; No carotid bruits LYMPHATICS: No lymphadenopathy CARDIAC: RRR, no murmurs, rubs, gallops RESPIRATORY:  Clear to auscultation without rales, wheezing or rhonchi  ABDOMEN: Soft, non-tender, non-distended MUSCULOSKELETAL:  No edema; No deformity  SKIN: Warm and dry NEUROLOGIC:  Alert and oriented x 3 PSYCHIATRIC:  Anxious appearing  ASSESSMENT:    1. Chest pain of uncertain etiology   2. Palpitations   3. Essential hypertension   4. ETOH abuse   5. Vaping nicotine dependence, tobacco product   6. Anxiety    PLAN:    In order of problems listed above:  Chest pain - She has features of cardiac and non-cardiac type chest pain. She had a coronary CTA 2020, calcium score 0, because this was reassuring I do not suspect her pain is cardiac in nature. Because of her other risk factors (HTN, tobacco/vaping, ETOH abuse) will proceed with a  stress echo.  Palpitations - These have been bothersome to her. She denies any associated symptoms with    them. She does not want to start the propranolol again. Previously wore long term monitor in 2020 showed minimum HR 66, max 163, average HR 114, No PVCs, VT, or PACs. Will arrange for a 14 day zio monitor. Will check CBC, TSH, magnesium, and BMET to rule out any organic causes.  Essential HTN - BP today is 138/78 and is controlled. She stopped her propranolol ~ year ago. She does not want to take any unnecessary medications at this time. Discussed vaping and her ETOH abuse and how these directly affect her BP. ETOH abuse - She is drinking 4 alcoholic (hard liquor) most days of the week, to help deal with her anxiety. Discussed effects of ETOH on her body and how it does not manage anxiety. Recommend that she follow up with her PCP and truly get to the cause of her anxiety and consider treatment options for that.  Vaping - Discussed total cessation.  Anxiety - Anxious appearing. Using ETOH to help with anxiety. Reports high stress level.        Shared Decision Making/Informed Consent The risks [chest pain, shortness of breath, cardiac arrhythmias, dizziness, blood pressure fluctuations, myocardial infarction, stroke/transient ischemic attack, and life-threatening complications (estimated to be 1 in 10,000)], benefits (risk stratification, diagnosing coronary artery disease, treatment guidance) and alternatives of a stress echocardiogram were discussed in detail with Ms. Twyman and she agrees to proceed.    Medication Adjustments/Labs and Tests Ordered: Current medicines are reviewed at length with the patient today.  Concerns regarding medicines are outlined above.  Orders Placed This Encounter  Procedures   Basic metabolic panel   CBC   Magnesium   TSH   LONG TERM MONITOR (3-14 DAYS)   EKG 12-Lead   ECHOCARDIOGRAM STRESS TEST   No orders of the defined types were placed in this  encounter.   Patient Instructions  Medication Instructions:  Your physician recommends that you continue on your current medications as directed. Please refer to the Current Medication list given to you today.  *If you need a refill on your cardiac medications before your next appointment, please call your pharmacy*   Lab Work: TODAY:  BMET, CBC, MAG, & TSH  If you have labs (blood work) drawn today and your tests are completely normal, you will receive your results only by: MyChart Message (if you have MyChart) OR A paper copy in the mail If you have any lab test that is abnormal or we need to change your treatment, we will call you to review the results.   Testing/Procedures: Your physician has requested that you have a stress echocardiogram. For further information please visit [email or web address]. Please follow instruction BELOW:   Your doctor has ordered a stress echo to help determine the condition of your heart during exercise.  If you take blood pressure medication, ask your doctor if you should take it the day of your test.  You should not have anything to eat or drink at least 4 hours before your test is scheduled.  You will be asked to undress from the waist up and given a hospital gown to wear, so dress comfortably from the waist down for example:   Sweat pants, shorts, or skirt  Rubber soled lace up shoes (tennis shoes)    ZIO XT- Long Term Monitor Instructions  Your physician has requested you wear a ZIO patch monitor for 14 days.  This is a single patch monitor. [employer] supplies one patch monitor per enrollment. Additional stickers are not available. Please do not apply patch if you will be having a Nuclear Stress Test,  Echocardiogram, Cardiac CT, MRI, or Chest Xray during the period you would be wearing the  monitor. The patch cannot be worn during these tests. You cannot remove and re-apply the  ZIO XT patch monitor.  Your ZIO patch monitor will be mailed 3  day USPS to your address on file. It may take 3-5 days  to receive your monitor after you have been enrolled.  Once you have received your monitor, please review the enclosed instructions. Your monitor  has already been registered assigning a specific monitor serial # to you.  Billing and Patient Assistance Program Information  We have supplied [employer] with any of your insurance information on file for billing purposes. [employer] offers a sliding scale Patient Assistance Program for patients that do not have  insurance, or whose insurance does not completely cover the cost of the ZIO monitor.  You must apply for the Patient Assistance Program to qualify for this discounted rate.  To apply, please call [employer] at [phone], select option 4, select option 2, ask to apply for  Patient Assistance Program. Meredeth [employer] will ask your household income, and how many people  are in your household. They will quote your out-of-pocket cost based on that information.  [employer] will also be able to set up a 1075-month, interest-free payment plan if needed.  Applying the monitor   Shave hair from upper left chest.  Hold abrader disc by orange tab. Rub abrader in 40 strokes over the upper left chest as  indicated in your monitor instructions.  Clean area with 4 enclosed alcohol pads. Let dry.  Apply patch as indicated in monitor instructions. Patch will be placed under collarbone on left  side of chest with arrow pointing upward.  Rub patch adhesive wings for 2 minutes. Remove white label marked "1". Remove  the white  label marked "2". Rub patch adhesive wings for 2 additional minutes.  While looking in a mirror, press and release button in center of patch. A small green light will  flash 3-4 times. This will be your only indicator that the monitor has been turned on.  Do not shower for the first 24 hours. You may shower after the first 24 hours.  Press the button if you feel a symptom. You will hear a small  click. Record Date, Time and  Symptom in the Patient Logbook.  When you are ready to remove the patch, follow instructions on the last 2 pages of Patient  Logbook. Stick patch monitor onto the last page of Patient Logbook.  Place Patient Logbook in the blue and white box. Use locking tab on box and tape box closed  securely. The blue and white box has prepaid postage on it. Please place it in the mailbox as  soon as possible. Your physician should have your test results approximately 7 days after the  monitor has been [facility].  Call [facility] Customer Care at [phone] if you have questions regarding  your ZIO XT patch monitor. Call them immediately if you see an orange light blinking on your  monitor.  If your monitor falls off in less than 4 days, contact our Monitor department at [phone].  If your monitor becomes loose or falls off after 4 days call [employer] at [phone] for  suggestions on securing your monitor  Follow-Up: At [facility], you and your health needs are our priority.  As part of our continuing mission to provide you with exceptional heart care, we have created designated Provider Care Teams.  These Care Teams include your primary Cardiologist (physician) and Advanced Practice Providers (APPs -  Physician Assistants and Nurse Practitioners) who all work together to provide you with the care you need, when you need it.  We recommend signing up for the patient portal called "MyChart".  Sign up information is provided on this After Visit Summary.  MyChart is used to connect with patients for Virtual Visits (Telemedicine).  Patients are able to view lab/test results, encounter notes, upcoming appointments, etc.  Non-urgent messages can be sent to your provider as well.   To learn more about what you can do with MyChart, go to [email or web address].    Your next appointment:   2 month(s)  The format for your next appointment:    In Person  Provider:   Gypsy Balsam, MD or Norman Herrlich, MD    Other Instructions   Important Information About Sugar         Signed, Flossie Dibble, NP  07/05/2022 5:07 PM     [facility]

## 2022-07-05 NOTE — Patient Instructions (Addendum)
Medication Instructions:  Your physician recommends that you continue on your current medications as directed. Please refer to the Current Medication list given to you today.  *If you need a refill on your cardiac medications before your next appointment, please call your pharmacy*   Lab Work: TODAY:  BMET, CBC, MAG, & TSH  If you have labs (blood work) drawn today and your tests are completely normal, you will receive your results only by: MyChart Message (if you have MyChart) OR A paper copy in the mail If you have any lab test that is abnormal or we need to change your treatment, we will call you to review the results.   Testing/Procedures: Your physician has requested that you have a stress echocardiogram. For further information please visit [email or web address] Please follow instruction BELOW:   Your doctor has ordered a stress echo to help determine the condition of your heart during exercise.  If you take blood pressure medication, ask your doctor if you should take it the day of your test.  You should not have anything to eat or drink at least 4 hours before your test is scheduled.  You will be asked to undress from the waist up and given a hospital gown to wear, so dress comfortably from the waist down for example:   Sweat pants, shorts, or skirt  Rubber soled lace up shoes (tennis shoes)    ZIO XT- Long Term Monitor Instructions  Your physician has requested you wear a ZIO patch monitor for 14 days.  This is a single patch monitor. [employer] supplies one patch monitor per enrollment. Additional stickers are not available. Please do not apply patch if you will be having a Nuclear Stress Test,  Echocardiogram, Cardiac CT, MRI, or Chest Xray during the period you would be wearing the  monitor. The patch cannot be worn during these tests. You cannot remove and re-apply the  ZIO XT patch monitor.  Your ZIO patch monitor will be mailed 3 day USPS to your address on file. It may  take 3-5 days  to receive your monitor after you have been enrolled.  Once you have received your monitor, please review the enclosed instructions. Your monitor  has already been registered assigning a specific monitor serial # to you.  Billing and Patient Assistance Program Information  We have supplied [employer] with any of your insurance information on file for billing purposes. [employer] offers a sliding scale Patient Assistance Program for patients that do not have  insurance, or whose insurance does not completely cover the cost of the ZIO monitor.  You must apply for the Patient Assistance Program to qualify for this discounted rate.  To apply, please call [employer] at [phone], select option 4, select option 2, ask to apply for  Patient Assistance Program. Meredeth Ide will ask your household income, and how many people  are in your household. They will quote your out-of-pocket cost based on that information.  [employer] will also be able to set up a 95-month, interest-free payment plan if needed.  Applying the monitor   Shave hair from upper left chest.  Hold abrader disc by orange tab. Rub abrader in 40 strokes over the upper left chest as  indicated in your monitor instructions.  Clean area with 4 enclosed alcohol pads. Let dry.  Apply patch as indicated in monitor instructions. Patch will be placed under collarbone on left  side of chest with arrow pointing upward.  Rub patch adhesive wings for 2 minutes. Remove white label  marked "1". Remove the white  label marked "2". Rub patch adhesive wings for 2 additional minutes.  While looking in a mirror, press and release button in center of patch. A small green light will  flash 3-4 times. This will be your only indicator that the monitor has been turned on.  Do not shower for the first 24 hours. You may shower after the first 24 hours.  Press the button if you feel a symptom. You will hear a small click. Record Date, Time and  Symptom in  the Patient Logbook.  When you are ready to remove the patch, follow instructions on the last 2 pages of Patient  Logbook. Stick patch monitor onto the last page of Patient Logbook.  Place Patient Logbook in the blue and white box. Use locking tab on box and tape box closed  securely. The blue and white box has prepaid postage on it. Please place it in the mailbox as  soon as possible. Your physician should have your test results approximately 7 days after the  monitor has been mailed back to [facility].  Call [facility] Customer Care at [phone] if you have questions regarding  your ZIO XT patch monitor. Call them immediately if you see an orange light blinking on your  monitor.  If your monitor falls off in less than 4 days, contact our Monitor department at [phone].  If your monitor becomes loose or falls off after 4 days call [employer] at [phone] for  suggestions on securing your monitor  Follow-Up: At [facility], you and your health needs are our priority.  As part of our continuing mission to provide you with exceptional heart care, we have created designated Provider Care Teams.  These Care Teams include your primary Cardiologist (physician) and Advanced Practice Providers (APPs -  Physician Assistants and Nurse Practitioners) who all work together to provide you with the care you need, when you need it.  We recommend signing up for the patient portal called "MyChart".  Sign up information is provided on this After Visit Summary.  MyChart is used to connect with patients for Virtual Visits (Telemedicine).  Patients are able to view lab/test results, encounter notes, upcoming appointments, etc.  Non-urgent messages can be sent to your provider as well.   To learn more about what you can do with MyChart, go to [email or web address].    Your next appointment:   2 month(s)  The format for your next appointment:   In Person  Provider:   Gypsy Balsam, MD or Norman Herrlich, MD    Other Instructions   Important Information About Sugar

## 2022-07-06 LAB — CBC
Hematocrit: 43.1 % (ref 34.0–46.6)
Hemoglobin: 14.8 g/dL (ref 11.1–15.9)
MCH: 32.5 pg (ref 26.6–33.0)
MCHC: 34.3 g/dL (ref 31.5–35.7)
MCV: 95 fL (ref 79–97)
Platelets: 364 x10E3/uL (ref 150–450)
RBC: 4.55 x10E6/uL (ref 3.77–5.28)
RDW: 11.5 % — ABNORMAL LOW (ref 11.7–15.4)
WBC: 12.2 x10E3/uL — ABNORMAL HIGH (ref 3.4–10.8)

## 2022-07-06 LAB — BASIC METABOLIC PANEL WITH GFR
BUN/Creatinine Ratio: 16 (ref 9–23)
BUN: 13 mg/dL (ref 6–20)
CO2: 22 mmol/L (ref 20–29)
Calcium: 10 mg/dL (ref 8.7–10.2)
Chloride: 101 mmol/L (ref 96–106)
Creatinine, Ser: 0.79 mg/dL (ref 0.57–1.00)
Glucose: 87 mg/dL (ref 70–99)
Potassium: 4.7 mmol/L (ref 3.5–5.2)
Sodium: 140 mmol/L (ref 134–144)
eGFR: 100 mL/min/1.73

## 2022-07-06 LAB — TSH: TSH: 7.85 u[IU]/mL — ABNORMAL HIGH (ref 0.450–4.500)

## 2022-07-06 LAB — MAGNESIUM: Magnesium: 2 mg/dL (ref 1.6–2.3)

## 2022-07-07 DIAGNOSIS — R002 Palpitations: Secondary | ICD-10-CM | POA: Diagnosis not present

## 2022-07-14 ENCOUNTER — Telehealth: Payer: Self-pay

## 2022-07-14 DIAGNOSIS — R079 Chest pain, unspecified: Secondary | ICD-10-CM

## 2022-07-14 DIAGNOSIS — R002 Palpitations: Secondary | ICD-10-CM

## 2022-07-14 NOTE — Addendum Note (Signed)
Addended by: Frutoso Schatz on: 07/14/2022 04:12 PM   Modules accepted: Orders

## 2022-07-14 NOTE — Telephone Encounter (Signed)
-----   Message from Elliot Cousin, [place] sent at 07/14/2022  6:32 AM EST ----- Regarding: FW: Stress Echo Denial See message below from Wallis Bamberg, NP.  Her stuff comes to triage.   Thanks! ----- Message ----- From: Flossie Dibble, NP Sent: 07/13/2022   4:24 PM EST To: Elliot Cousin, RMA Subject: FW: Stress Echo Denial                         Redgie Grayer!  See message from [place] below....  Would you be able to cancel the stress echo and instead order an ETT and an echo please?  Thank you, Victorino Dike  ----- Message ----- From: Kennon Rounds Sent: 07/13/2022  12:09 PM EST To: Flossie Dibble, NP Subject: RE: Stress Echo Denial                         You can just cancel the stress echo and request an ETT (exercise tolerance test) and a trans-thoracic echocardiogram for chest pain. I would think they would approve those. ----- Message ----- From: Flossie Dibble, NP Sent: 07/13/2022  11:59 AM EST To: Beatrice Lecher, PA-C Subject: FW: Stress Echo Denial                         Delphia Grates,  Not sure what I should do with this, not sure what the next steps would be.   Any suggestions?  Thank you, Victorino Dike  ----- Message ----- From: Ursula Alert Sent: 07/13/2022  11:10 AM EST To: Elliot Cousin, RMA; Cordie Grice; # Subject: Stress Echo Denial                             Good Morning,   Hope all is well, the original authorization request for the Stress Echo was denied by [facility]. I submitted an appeal to try to get the denial overturned. I spoke to [facility] a few minutes ago and was informed the appeal was denied, the carrier deemed the exam not medically necessary.   Thank you,   Ihor Gully

## 2022-07-14 NOTE — Telephone Encounter (Signed)
Spoke with the patient and advised stress echo was denied. Echo and ETT have been ordered.

## 2022-07-20 ENCOUNTER — Telehealth (HOSPITAL_COMMUNITY): Payer: Self-pay | Admitting: *Deleted

## 2022-07-20 ENCOUNTER — Other Ambulatory Visit: Payer: Medicaid Other

## 2022-07-20 NOTE — Telephone Encounter (Signed)
Per DPR left instructions for stress echo scheduled on 07/27/22.

## 2022-07-27 ENCOUNTER — Telehealth: Payer: Self-pay

## 2022-07-27 ENCOUNTER — Ambulatory Visit (INDEPENDENT_AMBULATORY_CARE_PROVIDER_SITE_OTHER): Payer: Medicaid Other

## 2022-07-27 ENCOUNTER — Ambulatory Visit: Payer: Medicaid Other | Attending: Cardiology

## 2022-07-27 DIAGNOSIS — I5030 Unspecified diastolic (congestive) heart failure: Secondary | ICD-10-CM | POA: Diagnosis not present

## 2022-07-27 DIAGNOSIS — R002 Palpitations: Secondary | ICD-10-CM

## 2022-07-27 DIAGNOSIS — R079 Chest pain, unspecified: Secondary | ICD-10-CM | POA: Diagnosis not present

## 2022-07-27 DIAGNOSIS — I351 Nonrheumatic aortic (valve) insufficiency: Secondary | ICD-10-CM

## 2022-07-27 LAB — EXERCISE TOLERANCE TEST
Estimated workload: 7
Exercise duration (min): 6 min
MPHR: 185 {beats}/min
Peak HR: 166 {beats}/min
Percent HR: 89 %
Rest HR: 90 {beats}/min

## 2022-07-27 LAB — ECHOCARDIOGRAM COMPLETE
Area-P 1/2: 3.93 cm2
P 1/2 time: 358 ms
S' Lateral: 2.8 cm

## 2022-07-27 NOTE — Telephone Encounter (Signed)
-----   Message from Trudi Ida, NP sent at 07/27/2022  3:10 PM EST ----- Please let Amber [name] know that her echo report is largely unchanged. Her squeezing function is normal. She does have grade 1 diastolic dysfunction (her heart does not relax fully) that was not present on her previous echo. There is nothing we would do differently for that at this time. Lifestyle modifications should be strongly considered, regular exercise, avoiding alcohol, and minimizing her anxiety and stress level. Overall, a normal report.

## 2022-07-27 NOTE — Telephone Encounter (Signed)
Results reviewed with patient who verbalizes understanding. Patient endorses completing stress test today and states she has also completed Long term heart monitor.   Patient asking if Amber [name] can renew her propanolol prescription, she states she has had some "heart racing" sensations/palpitations. She denies any SOB or lightheadedness.

## 2022-07-28 ENCOUNTER — Other Ambulatory Visit: Payer: Medicaid Other

## 2022-07-28 ENCOUNTER — Telehealth: Payer: Self-pay

## 2022-07-28 DIAGNOSIS — R002 Palpitations: Secondary | ICD-10-CM

## 2022-07-28 MED ORDER — PROPRANOLOL HCL 20 MG PO TABS: 20.0000 mg | ORAL_TABLET | Freq: Two times a day (BID) | ORAL | 3 refills | Status: DC

## 2022-07-28 NOTE — Telephone Encounter (Signed)
-----   Message from Trudi Ida, NP sent at 07/27/2022  4:57 PM EST ----- Regarding: RE: Propanolol prescription Yes, could you please send in propranolol 20 mg twice/day for palpitations? Also, her ETT resulted, it was normal, just showed she is deconditioned.   Thank you, Anderson Malta    ----- Message ----- From: Joni Reining, RN Sent: 07/27/2022   3:42 PM EST To: Trudi Ida, NP Subject: Propanolol prescription                        Patient requesting propanolol prescription for palpitations. She states she stopped taking it awhile ago but after visit with you she has reconsidered and is now willing to take it. Please see phone note for today.  Danae Chen, RN ----- Message ----- From: Trudi Ida, NP Sent: 07/27/2022   3:10 PM EST To: [address] Triage  Please let Ms. Wands know that her echo report is largely unchanged. Her squeezing function is normal. She does have grade 1 diastolic dysfunction (her heart does not relax fully) that was not present on her previous echo. There is nothing we would do differently for that at this time. Lifestyle modifications should be strongly considered, regular exercise, avoiding alcohol, and minimizing her anxiety and stress level. Overall, a normal report.

## 2022-07-28 NOTE — Telephone Encounter (Signed)
Order for propanolol placed per J. Gretta Cool, NP. Call to patient to review ETT results. Education provided regarding improving deconditioning with consistent but light exercise to her tolerance. Advised patient to call back if any new symptoms occur or if palpitation unrelieved by propanolol. Patient verbalizes understanding.

## 2022-07-28 NOTE — Telephone Encounter (Signed)
Propranolol rx sent to pharmacy on file. Pt made aware via MyChart or ETT results and RX submission.

## 2022-07-28 NOTE — Telephone Encounter (Signed)
-----   Message from Jennifer C Woody, NP sent at 07/27/2022  4:57 PM EST ----- Regarding: RE: Propanolol prescription Yes, could you please send in propranolol 20 mg twice/day for palpitations? Also, her ETT resulted, it was normal, just showed she is deconditioned.   Thank you, Jennifer    ----- Message ----- From: Melchor Kirchgessner L, RN Sent: 07/27/2022   3:42 PM EST To: Jennifer C Woody, NP Subject: Propanolol prescription                        Patient requesting propanolol prescription for palpitations. She states she stopped taking it awhile ago but after visit with you she has reconsidered and is now willing to take it. Please see phone note for today.  Christopher Hink, RN ----- Message ----- From: Woody, Jennifer C, NP Sent: 07/27/2022   3:10 PM EST To: [facility] Triage  Please let Amber [name] know that her echo report is largely unchanged. Her squeezing function is normal. She does have grade 1 diastolic dysfunction (her heart does not relax fully) that was not present on her previous echo. There is nothing we would do differently for that at this time. Lifestyle modifications should be strongly considered, regular exercise, avoiding alcohol, and minimizing her anxiety and stress level. Overall, a normal report.    

## 2022-09-13 ENCOUNTER — Ambulatory Visit: Payer: Medicaid Other | Admitting: Cardiology

## 2022-09-13 DIAGNOSIS — Z6830 Body mass index (BMI) 30.0-30.9, adult: Secondary | ICD-10-CM | POA: Insufficient documentation

## 2022-11-11 ENCOUNTER — Ambulatory Visit: Payer: Medicaid Other | Admitting: Cardiology

## 2022-11-13 NOTE — Progress Notes (Unsigned)
Cardiology Office Note:    Date:  11/15/2022   ID:  Amber [name], DOB Nov 14, 1985, MRN [id number]  PCP:  Galvin Proffer, MD  Cardiologist:  Norman Herrlich, MD    Referring MD: Galvin Proffer, MD    ASSESSMENT:    1. Palpitations    PLAN:    In order of problems listed above:  Extensive evaluation including echo coronary calcium score monitor all unremarkable reassuring continues to have symptoms Encouraged her to purchase the mobile Kardia device she can capture in the future and can send to me through MyChart Low-dose beta-blocker she can take as needed   Next appointment: 1 year   Medication Adjustments/Labs and Tests Ordered: Current medicines are reviewed at length with the patient today.  Concerns regarding medicines are outlined above.  No orders of the defined types were placed in this encounter.  No orders of the defined types were placed in this encounter.   Chief complaint follow-up after testing   History of Present Illness:    Amber [name] is a 37 y.o. female with a hx of hypertension cigarette smoking last seen 07/05/2022 with chest pain and palpitation.  On the visit she had cardiac testing performed including an echocardiogram left ventricle normal in size function EF 60 to 65% right ventricle normal size and function and no valvular abnormality.  She had a treadmill stress test performed 07/27/2022 which showed no evidence of ischemia exercise tolerance 7 METS achieving 89% of maximal predicted heart rate.  An event monitor reported 08/02/2022 showed sinus rhythm no atrial fibrillation or flutter both ventricular and supraventricular ectopy were rare and symptomatic episodes were reported associated with APCs and PVCs.  Compliance with diet, lifestyle and medications: Yes  She still has brief episodes of rapid heart rhythm I will give her a prescription for low-dose beta-blocker she can take if needed provided she is not pregnant encouraged her  to purchase mobile Kardia to capture and consider me through MyChart She may be pregnant as a closing for a new home this week Past Medical History:  Diagnosis Date   Acute pharyngitis, unspecified    Allergic rhinitis    Allergy    Anemia    Anxiety    Arthritis    Asthma 04/14/2016   Chronic fatigue    Chronic nausea    Clotting disorder (HCC)    Colitis    COPD (chronic obstructive pulmonary disease) (HCC)    Cystitis, unspecified without hematuria    Depression    Dizziness 08/24/2017   Dyspnea    Fibromyalgia    Fluid retention    GERD (gastroesophageal reflux disease)    Heart murmur    Herpes simplex type 2 infection    History of Clostridioides difficile colitis    HPV (human papilloma virus) infection    Hypertensive disorder 02/16/2018   Hyperthyroidism    Hypothyroidism 04/03/2019   IBS (irritable bowel syndrome)    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 Normal colonoscopy to the terminal ileum    DILATION AND CURETTAGE OF UTERUS  2013   DILATION AND EVACUATION  02/02/2012   Procedure: DILATATION AND EVACUATION;  Surgeon: Levi Aland, MD;  Location: [facility];  Service: Gynecology;  Laterality: N/A;   ESOPHAGOGASTRODUODENOSCOPY  04/14/2017   Dr. Jennye Boroughs. Normal upper endoscopy. Empiric esophageal dilation to 50 [place]    Current Medications: Current Meds  Medication Sig   ALTAVERA 0.15-30 MG-MCG tablet Take 1 tablet by  mouth daily.   ammonium lactate (AMLACTIN) 12 % cream Apply 1 Application topically as needed for dry skin.   Levothyroxine Sodium 50 MCG CAPS Take 50 mcg by mouth daily.   NUVIGIL 200 MG TABS Take 1 tablet by mouth daily.   propranolol (INDERAL) 20 MG tablet Take 1 tablet (20 mg total) by mouth 2 (two) times daily. For palpitations   SYMBICORT 160-4.5 MCG/ACT inhaler Inhale 2 puffs into the lungs daily.   valACYclovir (VALTREX) 500 MG tablet Take 500 mg by mouth daily.   VENTOLIN HFA 108 (90 Base) MCG/ACT inhaler Inhale 1-2 puffs into the lungs as needed for wheezing or shortness of breath.   Current Facility-Administered Medications for the 11/15/22 encounter (Office Visit) with Baldo Daub, MD  Medication   0.9 %  sodium chloride infusion     Allergies:   Amoxicillin-pot clavulanate, Sulfa antibiotics, Sulfamethoxazole-trimethoprim, and Hydrocodone-acetaminophen   Social History   Socioeconomic History   Marital status: Single    Spouse name: Not on file   Number of children: Not on file   Years of education: Not on file   Highest education level: Not on file  Occupational History   Not on file  Tobacco Use   Smoking status: Every Day    Packs/day: 1.00    Years: 16.00    Additional pack years: 0.00    Total pack years: 16.00    Types: Cigarettes   Smokeless tobacco: Never  Vaping Use   Vaping Use: Former  Substance and Sexual Activity   Alcohol use: No   Drug use: Yes    Types: Marijuana   Sexual activity: Yes  Other Topics Concern   Not on file  Social History Narrative   Not on file   Social Determinants of Health   Financial Resource Strain: Not on file  Food Insecurity: Not on file  Transportation Needs: Not on file  Physical Activity: Not on file  Stress: Not on file  Social Connections: Not on file     Family History: The patient's family history includes Breast cancer in her paternal grandmother; Cancer in her maternal aunt and maternal  grandmother; Colon cancer in an other family member; Heart disease in her father; Hypertension in her maternal grandmother; Vision loss in her maternal grandmother. There is no history of Stomach cancer, Rectal cancer, or Esophageal cancer. ROS:   Please see the history of present illness.    All other systems reviewed and are negative.  EKGs/Labs/Other Studies Reviewed:    The following studies were reviewed today:  Cardiac Studies & Procedures     STRESS TESTS  EXERCISE TOLERANCE TEST (ETT) 07/27/2022  Narrative Stress test was negative for evidence of inducible ischemia. Patient exercised for 6 minutes on standard Bruce protocol and she achieved 7 METS of exercise.  Peak heart rate 166 peak blood pressure 156/83 mmHg.  Patient achieved  89% predicted maximal heart rate. Test terminated because of dyspnea.  No chest pain occurred.  Poor exercise capacity.   ECHOCARDIOGRAM  ECHOCARDIOGRAM COMPLETE 07/27/2022  Narrative ECHOCARDIOGRAM REPORT    Patient Name:   Amber [name] Date of Exam: 07/27/2022 Medical Rec #:  [id number]         Height:       61.0 in Accession #:    [id number]        Weight:       152.2 lb Date of Birth:  1985-12-06          BSA:          1.682 m Patient Age:    36 years          BP:           138/78 mmHg Patient Gender: F                 HR:           93 bpm. Exam Location:    Procedure: Cardiac Doppler, Color Doppler and Strain Analysis  Indications:    Palpitations [R00.2 (ICD-10-CM)]; Chest pain of uncertain etiology [R07.9 (ICD-10-CM)]  History:        Patient has prior history of Echocardiogram examinations, most recent 04/04/2019. Risk Factors:Hypertension and Dyslipidemia. Raynaud's disease without gangrene [I73.00].  Sonographer:    Margreta Journey RDCS Referring Phys: [phone] JENNIFER C WOODY  IMPRESSIONS   1. Left ventricular ejection fraction, by estimation, is 60 to 65%. The left ventricle has normal function. The left  ventricle has no regional wall motion abnormalities. Left ventricular diastolic parameters are consistent with Grade I diastolic dysfunction (impaired relaxation).GLS -16.5% 2. The aortic valve is normal in structure. Aortic valve regurgitation is moderate. No aortic stenosis is present. 3. The inferior vena cava is normal in size with greater than 50% respiratory variability, suggesting right atrial pressure of 3 mmHg.  FINDINGS Left Ventricle: Left ventricular ejection fraction, by estimation, is 60 to 65%. The left ventricle has normal function. The left ventricle has no regional wall motion abnormalities. The left ventricular internal cavity size was normal in size. There is no left ventricular hypertrophy. Left ventricular diastolic parameters are consistent with Grade I diastolic dysfunction (impaired relaxation).  Right Ventricle: The right ventricular size is normal. No increase in right ventricular wall thickness. Right ventricular systolic function is normal.  Left Atrium: Left atrial size was normal in size.  Right Atrium: Right atrial size was normal in size.  Pericardium: There is no evidence of pericardial effusion.  Mitral Valve: The mitral valve is normal in structure. No evidence of mitral valve regurgitation. No evidence of mitral valve stenosis.  Tricuspid Valve: The tricuspid valve is normal in structure. Tricuspid valve regurgitation is not demonstrated. No evidence of tricuspid stenosis.  Aortic Valve: The aortic valve is normal in structure. Aortic valve regurgitation is moderate. Aortic regurgitation PHT measures 358 msec. No aortic stenosis is present.  Pulmonic Valve: The pulmonic valve was normal in structure. Pulmonic valve regurgitation is not visualized. No evidence of pulmonic stenosis.  Aorta: The aortic root is normal in size and structure.  Venous: The inferior vena cava is normal in size with greater than 50% respiratory variability, suggesting right  atrial pressure of 3 mmHg.  IAS/Shunts: No atrial level shunt detected by color flow Doppler.   LEFT VENTRICLE PLAX 2D LVIDd:         4.40 cm   Diastology LVIDs:  2.80 cm   LV e' medial:    9.14 cm/s LV PW:         0.80 cm   LV E/e' medial:  7.7 LV IVS:        0.80 cm   LV e' lateral:   11.50 cm/s LVOT diam:     1.90 cm   LV E/e' lateral: 6.1 LV SV:         70 LV SV Index:   41 LVOT Area:     2.84 cm   RIGHT VENTRICLE             IVC RV Basal diam:  2.00 cm     IVC diam: 1.60 cm RV S prime:     13.20 cm/s TAPSE (M-mode): 2.1 cm  LEFT ATRIUM             Index        RIGHT ATRIUM          Index LA diam:        2.70 cm 1.61 cm/m   RA Area:     8.24 cm LA Vol (A2C):   20.6 ml 12.25 ml/m  RA Volume:   14.00 ml 8.32 ml/m LA Vol (A4C):   17.7 ml 10.52 ml/m LA Biplane Vol: 20.4 ml 12.13 ml/m AORTIC VALVE LVOT Vmax:   139.00 cm/s LVOT Vmean:  91.600 cm/s LVOT VTI:    0.246 m AI PHT:      358 msec  AORTA Ao Root diam: 3.10 cm Ao Asc diam:  2.80 cm Ao Desc diam: 1.50 cm  MITRAL VALVE MV Area (PHT): 3.93 cm    SHUNTS MV Decel Time: 193 msec    Systemic VTI:  0.25 m MV E velocity: 70.30 cm/s  Systemic Diam: 1.90 cm MV A velocity: 71.10 cm/s MV E/A ratio:  0.99  Glean Hess Revankar MD Electronically signed by Belva Crome MD Signature Date/Time: 07/27/2022/2:37:23 PM    Final    MONITORS  LONG TERM MONITOR (3-14 DAYS) 08/02/2022  Narrative Patch Wear Time:  13 days and 23 hours (2023-12-21T13:50:18-0500 to 2024-01-04T13:50:14-0500)  Patient had a min HR of 43 bpm, max HR of 166 bpm, and avg HR of 85 bpm. Predominant underlying rhythm was Sinus Rhythm. Isolated SVEs were rare (<1.0%), SVE Triplets were rare (<1.0%), and no SVE Couplets were present. Isolated VEs were rare (<1.0%), and no VE Couplets or VE Triplets were present.  Symptoms associated with rare premature atrial complexes as well as rare premature ventricular complexes.  Conclusion: Symptomatic  rare premature atrial complex and symptomatic rare premature ventricular complex.   CT SCANS  CT CORONARY MORPH W/CTA COR W/SCORE 05/31/2019  Addendum 05/31/2019  4:45 PM ADDENDUM REPORT: 05/31/2019 16:43  HISTORY: Chest pain, cardiac etiology suspected chest pain  EXAM: Cardiac/Coronary CT  TECHNIQUE: The patient was scanned on a [employer].  PROTOCOL: A 120 kV prospective scan was triggered in the descending thoracic aorta at 111 HU's. Axial non-contrast 3 mm slices were carried out through the heart. The data set was analyzed on a dedicated work station and scored using the Agatson method. Gantry rotation speed was 250 msecs and collimation was 0.6 mm. Beta blockade and 0.8 mg of sl NTG was given. The 3D data set was reconstructed in 5% intervals of 35-75% of the R-R cycle. Diastolic phases were analyzed on a dedicated work station using MPR, MIP and VRT modes. The patient received OMNIPAQUE IOHEXOL 350 MG/ML SOLN of contrast.  FINDINGS:  Coronary calcium score: The patient's coronary artery calcium score is 0, which places the patient in the 0 percentile.  Coronary arteries: Normal coronary origins.  Right dominance.  Right Coronary Artery: Normal caliber vessel, gives rise to PDA. No significant plaque or stenosis.  Left Main Coronary Artery: Very short trunk, common ostium but then almost immediately bifurcates. No significant plaque or stenosis.  Left Anterior Descending Coronary Artery: Normal caliber vessel. No significant plaque or stenosis. Gives rise to 1 medium diagonal branch.  Left Circumflex Artery: Normal caliber vessel. No significant plaque or stenosis. Gives rise to 1 large OM branches.  Aorta: Normal size, 26 mm at the mid ascending aorta (level of the PA bifurcation) measured double oblique. No calcifications. No dissection.  Aortic Valve: No calcifications. Trileaflet.  Other findings:  Normal pulmonary vein drainage into  the left atrium.  Normal left atrial appendage without a thrombus.  Normal size of the pulmonary artery.  IMPRESSION: 1. No evidence of CAD, CADRADS = 0.  2. Coronary calcium score of 0. This was 0 percentile for age and sex matched control.  3. Normal coronary origin with right dominance.  4. Overall small caliber vessels without any plaque or stenosis.   Electronically Signed By: Jodelle Red M.D. On: 05/31/2019 16:43  Narrative EXAM: OVER-READ INTERPRETATION  CT CHEST  The following report is an over-read performed by radiologist Dr. Trudie Reed of [facility] on 05/31/2019. This over-read does not include interpretation of cardiac or coronary anatomy or pathology. The coronary calcium score/coronary CTA interpretation by the cardiologist is attached.  COMPARISON:  None.  FINDINGS: Within the visualized portions of the thorax there are no suspicious appearing pulmonary nodules or masses, there is no acute consolidative airspace disease, no pleural effusions, no pneumothorax and no lymphadenopathy. Visualized portions of the upper abdomen are unremarkable. There are no aggressive appearing lytic or blastic lesions noted in the visualized portions of the skeleton.  IMPRESSION: No significant incidental noncardiac findings are noted.  Electronically Signed: By: Trudie Reed M.D. On: 05/31/2019 09:41          Recent Labs: 07/05/2022: BUN 13; Creatinine, Ser 0.79; Hemoglobin 14.8; Magnesium 2.0; Platelets 364; Potassium 4.7; Sodium 140; TSH 7.850  Recent Lipid Panel No results found for: "CHOL", "TRIG", "HDL", "CHOLHDL", "VLDL", "LDLCALC", "LDLDIRECT"  Physical Exam:    VS:  BP (!) 148/92 (BP Location: Right Arm, Patient Position: Sitting)   Pulse 72   Ht 5\' 1"  (1.549 m)   Wt 159 lb (72.1 kg)   SpO2 98%   BMI 30.04 kg/m     Wt Readings from Last 3 Encounters:  11/15/22 159 lb (72.1 kg)  07/27/22 152 lb (68.9 kg)   07/05/22 152 lb 3.2 oz (69 kg)     GEN:    Well nourished, well developed in no acute distress HEENT: Normal NECK: No JVD; No carotid bruits LYMPHATICS: No lymphadenopathy CARDIAC: RRR, no murmurs, rubs, gallops RESPIRATORY:  Clear to auscultation without rales, wheezing or rhonchi  ABDOMEN: Soft, non-tender, non-distended MUSCULOSKELETAL:  No edema; No deformity  SKIN: Warm and dry NEUROLOGIC:  Alert and oriented x 3 PSYCHIATRIC:  Normal affect    Signed, Norman Herrlich, MD  11/15/2022 4:23 PM    [facility]

## 2022-11-15 ENCOUNTER — Ambulatory Visit: Payer: Medicaid Other | Attending: Cardiology | Admitting: Cardiology

## 2022-11-15 ENCOUNTER — Encounter: Payer: Self-pay | Admitting: Cardiology

## 2022-11-15 VITALS — BP 148/92 | HR 72 | Ht 61.0 in | Wt 159.0 lb

## 2022-11-15 DIAGNOSIS — R002 Palpitations: Secondary | ICD-10-CM | POA: Diagnosis not present

## 2022-11-15 MED ORDER — METOPROLOL TARTRATE 25 MG PO TABS: 12.5000 mg | ORAL_TABLET | Freq: Every day | ORAL | 3 refills | Status: AC | PRN

## 2022-11-15 NOTE — Patient Instructions (Addendum)
Medication Instructions:  Your physician has recommended you make the following change in your medication:   START: Lopressor 12.5 mg daily as needed for rapid heart rate.  *If you need a refill on your cardiac medications before your next appointment, please call your pharmacy*   Lab Work: None If you have labs (blood work) drawn today and your tests are completely normal, you will receive your results only by: MyChart Message (if you have MyChart) OR A paper copy in the mail If you have any lab test that is abnormal or we need to change your treatment, we will call you to review the results.   Testing/Procedures: None   Follow-Up: At [facility], you and your health needs are our priority.  As part of our continuing mission to provide you with exceptional heart care, we have created designated Provider Care Teams.  These Care Teams include your primary Cardiologist (physician) and Advanced Practice Providers (APPs -  Physician Assistants and Nurse Practitioners) who all work together to provide you with the care you need, when you need it.  We recommend signing up for the patient portal called "MyChart".  Sign up information is provided on this After Visit Summary.  MyChart is used to connect with patients for Virtual Visits (Telemedicine).  Patients are able to view lab/test results, encounter notes, upcoming appointments, etc.  Non-urgent messages can be sent to your provider as well.   To learn more about what you can do with MyChart, go to [email or web address].    Your next appointment:   1 year(s)  Provider:   Norman Herrlich, MD    Other Instructions Purchase a mobile kardia  1. Avoid all over-the-counter antihistamines except Claritin/Loratadine and Zyrtec/Cetrizine. 2. Avoid all combination including cold sinus allergies flu decongestant and sleep medications 3. You can use Robitussin DM Mucinex and Mucinex DM for cough. 4. can use Tylenol aspirin ibuprofen  and naproxen but no combinations such as sleep or sinus.  KardiaMobile [email or web address]        FDA-cleared, clinical grade mobile EKG monitor: Lourena Simmonds is the most clinically-validated mobile EKG used by the world's leading cardiac care medical professionals With Basic service, know instantly if your heart rhythm is normal or if atrial fibrillation is detected, and email the last single EKG recording to yourself or your doctor Premium service, available for purchase through the Kardia app for $9.99 per month or $99 per year, includes unlimited history and storage of your EKG recordings, a monthly EKG summary report to share with your doctor, along with the ability to track your blood pressure, activity and weight Includes one KardiaMobile phone clip FREE SHIPPING: Standard delivery 1-3 business days. Orders placed by 11:00am PST will ship that afternoon. Otherwise, will ship next business day. All orders ship via [employer] from [place], [place]

## 2022-12-26 DIAGNOSIS — G5603 Carpal tunnel syndrome, bilateral upper limbs: Secondary | ICD-10-CM | POA: Insufficient documentation

## 2023-07-07 DIAGNOSIS — R200 Anesthesia of skin: Secondary | ICD-10-CM | POA: Insufficient documentation

## 2023-09-14 NOTE — Progress Notes (Unsigned)
 Cardiology Office Note:    Date:  09/15/2023   ID:  Amber [name], DOB [birth year], MRN [id number]  PCP:  Galvin Proffer, MD   [facility] Providers Cardiologist:  Thomasene Ripple, DO     Referring MD: Galvin Proffer, MD   Chief Complaint  Patient presents with   Chest Pain   History of Present Illness:    Amber [name] is a 38 y.o. female with a hx of HTN, palpitations, anxiety, and former tobacco use.   08/02/22 monitor average HR 85 bpm, isolated SVEs 07/27/22 ETT low risk, no ischemia 07/27/2022 echo EF 60 to 65%, grade 1 diastolic dysfunction, no valvular abnormalities 05/31/2019 coronary CTA calcium score of 0  Most recently evaluated by Dr. Dulce [name] on 11/15/2022, she continued to be bothered by brief episodes of palpitations she was given a low-dose beta-blocker as needed and advised to follow-up in 1 year.  She presents today companied by a friend for follow-up with concerns of chest pain.  She states the episodes typically occur when she is at work, has a squeezing sensation-as she does endorse a high level of stress at work and she is not typically bothered by this when she is at home.  She also mentions that she self discontinued her propranolol around the same time the symptoms started, and approximately 5 days ago she restarted this and her symptoms have largely subsided.  She has also been bothered by bilateral hand pain, she is following with Ortho about this, there was some mention that she might need to see neuro. She is working on actively losing weight through occasions and has lost approximately 15 pounds.  She has stopped smoking, continues to vape, she is also cut down on her ETOH use. She denies chest pain, palpitations, dyspnea, pnd, orthopnea, n, v, dizziness, syncope, edema, weight gain, or early satiety.   EKG Interpretation Date/Time:  Friday September 15 2023 13:30:22 EST Ventricular Rate:  61 PR Interval:  116 QRS Duration:  78 QT  Interval:  404 QTC Calculation: 406 R Axis:   56  Text Interpretation: Normal sinus rhythm Normal ECG No previous ECGs available Confirmed by Wallis Bamberg [phone]) on 09/15/2023 1:34:14 PM    Past Medical History:  Diagnosis Date   Acute pharyngitis, unspecified    Allergic rhinitis    Allergy    Anemia    Anxiety    Arthritis    Asthma 04/14/2016   Chronic fatigue    Chronic nausea    Clotting disorder (HCC)    Colitis    COPD (chronic obstructive pulmonary disease) (HCC)    Cystitis, unspecified without hematuria    Depression    Dizziness 08/24/2017   Dyspnea    Fibromyalgia    Fluid retention    GERD (gastroesophageal reflux disease)    Heart murmur    Herpes simplex type 2 infection    History of Clostridioides difficile colitis    HPV (human papilloma virus) infection    Hypertensive disorder 02/16/2018   Hyperthyroidism    Hypothyroidism 04/03/2019   IBS (irritable bowel syndrome)    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 Normal colonoscopy to the terminal ileum    DILATION AND CURETTAGE OF UTERUS  2013   DILATION AND EVACUATION  02/02/2012   Procedure: DILATATION AND EVACUATION;  Surgeon: Levi Aland, MD;  Location: [facility];  Service: Gynecology;  Laterality: N/A;   ESOPHAGOGASTRODUODENOSCOPY  04/14/2017   Dr. Jennye Boroughs. Normal upper endoscopy. Empiric esophageal dilation to 50 [place]    Current Medications: Current Meds   Medication Sig   ADDERALL 10 MG tablet Take 10 mg by mouth 2 (two) times daily with a meal.   ALTAVERA 0.15-30 MG-MCG tablet Take 1 tablet by mouth daily.   gentamicin ointment (GARAMYCIN) 0.1 % Apply 1 Application topically daily. Apply to wound daily   Levothyroxine Sodium 50 MCG CAPS Take 50 mcg by mouth daily.   metoprolol tartrate (LOPRESSOR) 25 MG tablet Take 0.5 tablets (12.5 mg total) by mouth daily as needed (Rapid Heart Rate).   propranolol (INDERAL) 20 MG tablet Take 1 tablet (20 mg total) by mouth 2 (two) times daily. For palpitations   SYMBICORT 160-4.5 MCG/ACT inhaler Inhale 2 puffs into the lungs daily.   valACYclovir (VALTREX) 500 MG tablet Take 500 mg by mouth daily.   VENTOLIN HFA 108 (90 Base) MCG/ACT inhaler Inhale 1-2 puffs into the lungs as needed for wheezing or shortness of breath.   Current Facility-Administered Medications for the 09/15/23 encounter (Office Visit) with Flossie Dibble, NP  Medication   0.9 %  sodium chloride infusion     Allergies:   Amoxicillin-pot clavulanate, Sulfa antibiotics, Sulfamethoxazole-trimethoprim, and Hydrocodone-acetaminophen   Social History   Socioeconomic History   Marital status: Single    Spouse name: Not on file   Number of children: Not on file   Years of education: Not on file   Highest education level: Not on file  Occupational History   Not on file  Tobacco Use   Smoking status: Former    Current packs/day: 1.00    Average packs/day: 1 pack/day for 16.0 years (16.0 ttl pk-yrs)    Types: Cigarettes   Smokeless tobacco: Never  Vaping Use   Vaping status: Every Day  Substance and Sexual Activity   Alcohol use: No   Drug use: Yes    Types: Marijuana   Sexual activity: Yes  Other Topics Concern   Not on file  Social History Narrative   Not on file   Social Drivers of Health   Financial Resource Strain: Not on file  Food Insecurity: Not on file  Transportation Needs: Not on file  Physical Activity: Not  on file  Stress: Not on file  Social Connections: Unknown (11/30/2021)   Received from [facility]   Social Network    Social Network: Not on file     Family History: The patient's family history includes Breast cancer in her paternal grandmother; Cancer in her maternal aunt and maternal grandmother; Colon cancer in an other family member; Heart disease in her father; Hypertension in her maternal grandmother; Vision loss in her maternal grandmother. There is no history of Stomach cancer, Rectal cancer, or Esophageal cancer.  ROS:   Review of Systems  Constitutional: Negative.   HENT: Negative.    Eyes: Negative.   Respiratory: Negative.  Cardiovascular:  Positive for chest pain and palpitations. Negative for orthopnea, claudication, leg swelling and PND.  Gastrointestinal: Negative.   Genitourinary: Negative.   Musculoskeletal: Negative.   Skin: Negative.   Neurological:  Negative for dizziness and loss of consciousness.  Endo/Heme/Allergies: Negative.   Psychiatric/Behavioral:  The patient is nervous/anxious.      EKGs/Labs/Other Studies Reviewed:    The following studies were reviewed today:  05/31/19 Coronary CTA - Calcium score of 0, no evidence of CAD  04/04/19 Echo complete - LVEF 60-65%. Trace MVR.   02/20/19 Long term monitor - minimum HR 66, max 163, average HR 114. No PVCs, VT, or PACs.     EKG:  EKG is ordered today.  The ekg ordered today demonstrates NSR, consistent with previous tracings.   Recent Labs: No results found for requested labs within last 365 days.  Recent Lipid Panel No results found for: "CHOL", "TRIG", "HDL", "CHOLHDL", "VLDL", "LDLCALC", "LDLDIRECT"   Risk Assessment/Calculations:                Physical Exam:    VS:  BP 110/68 (BP Location: Right Arm, Patient Position: Sitting, Cuff Size: Normal)   Pulse 61   Ht 5\' 1"  (1.549 m)   Wt 145 lb (65.8 kg)   SpO2 99%   BMI 27.40 kg/m     Wt Readings from Last 3  Encounters:  09/15/23 145 lb (65.8 kg)  11/15/22 159 lb (72.1 kg)  07/27/22 152 lb (68.9 kg)     GEN:  Well nourished, well developed in no acute distress HEENT: Normal NECK: No JVD; No carotid bruits LYMPHATICS: No lymphadenopathy CARDIAC: RRR, no murmurs, rubs, gallops RESPIRATORY:  Clear to auscultation without rales, wheezing or rhonchi  ABDOMEN: Soft, non-tender, non-distended MUSCULOSKELETAL:  No edema; No deformity  SKIN: Warm and dry NEUROLOGIC:  Alert and oriented x 3 PSYCHIATRIC:  Anxious appearing  ASSESSMENT:    1. Chest pain of uncertain etiology   2. Essential hypertension   3. Vaping nicotine dependence, tobacco product   4. Palpitations     PLAN:    In order of problems listed above:  Chest pain - She has features of cardiac and non-cardiac type chest pain. She had a coronary CTA 2020 >> stress evaluation in January of last year which was negative for ischemia.  Chest pain is not consistent with angina, most likely consistent with stress as the symptoms largely resolved after she resumed her propranolol. Palpitations -currently quiescent, continue propranolol, also has short acting metoprolol if needed but she has not needed this. Essential HTN - BP today is well-controlled at 110/68. ETOH abuse -she is cut back significantly on her drinking, mostly she was drinking on social occasions. Vaping - Discussed total cessation.   Disposition-follow-up in 1 year.        Medication Adjustments/Labs and Tests Ordered: Current medicines are reviewed at length with the patient today.  Concerns regarding medicines are outlined above.  Orders Placed This Encounter  Procedures   EKG 12-Lead   No orders of the defined types were placed in this encounter.   Patient Instructions  Medication Instructions:  Your physician recommends that you continue on your current medications as directed. Please refer to the Current Medication list given to you today.  *If you  need a refill on your cardiac medications before your next appointment, please call your pharmacy*   Lab Work: None Ordered If you have labs (blood work) drawn today and your tests are completely normal,  you will receive your results only by: MyChart Message (if you have MyChart) OR A paper copy in the mail If you have any lab test that is abnormal or we need to change your treatment, we will call you to review the results.   Testing/Procedures: None Ordered   Follow-Up: At [facility], you and your health needs are our priority.  As part of our continuing mission to provide you with exceptional heart care, we have created designated Provider Care Teams.  These Care Teams include your primary Cardiologist (physician) and Advanced Practice Providers (APPs -  Physician Assistants and Nurse Practitioners) who all work together to provide you with the care you need, when you need it.  We recommend signing up for the patient portal called "MyChart".  Sign up information is provided on this After Visit Summary.  MyChart is used to connect with patients for Virtual Visits (Telemedicine).  Patients are able to view lab/test results, encounter notes, upcoming appointments, etc.  Non-urgent messages can be sent to your provider as well.   To learn more about what you can do with MyChart, go to [email or web address].    Your next appointment:   12 month follow up    Signed, Flossie Dibble, NP  09/15/2023 4:28 PM    [facility]

## 2023-09-15 ENCOUNTER — Ambulatory Visit: Payer: Medicaid Other | Attending: Cardiology | Admitting: Cardiology

## 2023-09-15 ENCOUNTER — Ambulatory Visit: Payer: Medicaid Other | Admitting: Podiatry

## 2023-09-15 ENCOUNTER — Ambulatory Visit (INDEPENDENT_AMBULATORY_CARE_PROVIDER_SITE_OTHER): Payer: Medicaid Other

## 2023-09-15 ENCOUNTER — Encounter: Payer: Self-pay | Admitting: *Deleted

## 2023-09-15 ENCOUNTER — Encounter: Payer: Self-pay | Admitting: Cardiology

## 2023-09-15 VITALS — BP 110/68 | HR 61 | Ht 61.0 in | Wt 145.0 lb

## 2023-09-15 DIAGNOSIS — F17290 Nicotine dependence, other tobacco product, uncomplicated: Secondary | ICD-10-CM

## 2023-09-15 DIAGNOSIS — M7752 Other enthesopathy of left foot: Secondary | ICD-10-CM | POA: Diagnosis not present

## 2023-09-15 DIAGNOSIS — I10 Essential (primary) hypertension: Secondary | ICD-10-CM | POA: Diagnosis not present

## 2023-09-15 DIAGNOSIS — S90415A Abrasion, left lesser toe(s), initial encounter: Secondary | ICD-10-CM | POA: Diagnosis not present

## 2023-09-15 DIAGNOSIS — M778 Other enthesopathies, not elsewhere classified: Secondary | ICD-10-CM

## 2023-09-15 DIAGNOSIS — R002 Palpitations: Secondary | ICD-10-CM | POA: Diagnosis not present

## 2023-09-15 DIAGNOSIS — R079 Chest pain, unspecified: Secondary | ICD-10-CM | POA: Diagnosis not present

## 2023-09-15 DIAGNOSIS — L97529 Non-pressure chronic ulcer of other part of left foot with unspecified severity: Secondary | ICD-10-CM | POA: Insufficient documentation

## 2023-09-15 DIAGNOSIS — E11621 Type 2 diabetes mellitus with foot ulcer: Secondary | ICD-10-CM

## 2023-09-15 DIAGNOSIS — R41840 Attention and concentration deficit: Secondary | ICD-10-CM | POA: Insufficient documentation

## 2023-09-15 MED ORDER — GENTAMICIN SULFATE 0.1 % EX OINT: 1.0000 | TOPICAL_OINTMENT | Freq: Every day | CUTANEOUS | 0 refills | Status: AC

## 2023-09-15 NOTE — Progress Notes (Signed)
 Chief Complaint  Patient presents with   Toe Pain    NP that has a skin abrasion on the left 2nd toe. Burning, red, and had a blister a weeks ago. The blister is was popped and drained but it is not getting any better. It was covered with a bandaid today. Not diabetic and no anti coag.    HPI: 38 y.o. female presents today with concern of a possible blister or abrasion on the top of her left second toe.  She notes that she popped it and it drained but feels that it is not getting better.  She has a Band-Aid on it today.  Notes that there has been minimal drainage.  Denies significant pain.  Upon review of the medical history with the patient, it is noted in the system that she is a type II diabetic, but the patient states that she was never told she is diabetic and is not being treated with any diabetic medication.  It appears that her blood glucose level was minimally elevated at 104 in 2020, but was 87 in 2023.  Past Medical History:  Diagnosis Date   Acute pharyngitis, unspecified    Allergic rhinitis    Allergy    Anemia    Anxiety    Arthritis    Asthma 04/14/2016   Chronic fatigue    Chronic nausea    Clotting disorder (HCC)    Colitis    COPD (chronic obstructive pulmonary disease) (HCC)    Cystitis, unspecified without hematuria    Depression    Dizziness 08/24/2017   Dyspnea    Fibromyalgia    Fluid retention    GERD (gastroesophageal reflux disease)    Heart murmur    Herpes simplex type 2 infection    History of Clostridioides difficile colitis    HPV (human papilloma virus) infection    Hypertensive disorder 02/16/2018   Hyperthyroidism    Hypothyroidism 04/03/2019   IBS (irritable bowel syndrome)    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 Normal colonoscopy to the terminal ileum    DILATION AND CURETTAGE OF UTERUS  2013   DILATION AND EVACUATION  02/02/2012   Procedure: DILATATION AND EVACUATION;  Surgeon: Levi Aland, MD;  Location: [facility];  Service: Gynecology;  Laterality: N/A;   ESOPHAGOGASTRODUODENOSCOPY  04/14/2017   Dr. Jennye Boroughs. Normal upper endoscopy. Empiric esophageal dilation to 50 [place]    Allergies  Allergen Reactions   Amoxicillin-Pot Clavulanate Nausea And Vomiting and Other (See Comments)    Other reaction(s): Other (See Comments), Unknown  Other reaction(s): Unknown  Other reaction(s): Unknown   Sulfa Antibiotics Nausea And Vomiting and Other (See Comments)    Other reaction(s): Other (See Comments)   Sulfamethoxazole-Trimethoprim Nausea And Vomiting and Other (See Comments)    Other reaction(s): Vomiting  Other reaction(s): Other (See Comments), Unknown  Other reaction(s): Unknown  Other reaction(s): Unknown  Other reaction(s): Unknown   Hydrocodone-Acetaminophen Nausea And Vomiting and Other (See Comments)    Other Reaction(s): GI Intolerance     Physical Exam: There are palpable pedal pulses noted.  No edema or erythema is noted surrounding the blister/abrasion on the left second toe.  The left second toe on the dorsal aspect of the DIPJ has a small, approximately 2 mm diameter abrasion noted.  This is partial-thickness.  It has a granular base.  No necrosis is noted.  There is some loose epidermis surrounding it noted which could be the roof of a previously drained blister.  Light touch sensation is intact.  Assessment/Plan of Care: 1. Abrasion of second toe of left foot,  initial encounter   2. Capsulitis of left foot   3. Type 2 diabetes mellitus with foot ulcer (CODE) (HCC)      Meds ordered this encounter  Medications   gentamicin ointment (GARAMYCIN) 0.1 %    Sig: Apply 1 Application topically daily. Apply to wound daily    Dispense:  30 g    Refill:  0   Discussed clinical findings with patient today.  Will send in prescription for gentamicin ointment for the patient to apply to the area followed by a light gauze dressing or a Band-Aid.  She will avoid pressure to the dorsal aspect of the toe for the next week until this resolves.  No oral antibiotic is necessary at this time.  She can perform Epsom salt soaks to the area as this will help disinfect the abrasion and prevent infection.  Follow-up in 1 to 2 weeks or as needed   Clerance Lav, DPM, FACFAS [facility]     [address] [place], [place] [address]                Office [phone]  Fax [phone]

## 2023-09-15 NOTE — Patient Instructions (Signed)
 Medication Instructions:  Your physician recommends that you continue on your current medications as directed. Please refer to the Current Medication list given to you today.  *If you need a refill on your cardiac medications before your next appointment, please call your pharmacy*   Lab Work: None Ordered If you have labs (blood work) drawn today and your tests are completely normal, you will receive your results only by: MyChart Message (if you have MyChart) OR A paper copy in the mail If you have any lab test that is abnormal or we need to change your treatment, we will call you to review the results.   Testing/Procedures: None Ordered   Follow-Up: At [facility], you and your health needs are our priority.  As part of our continuing mission to provide you with exceptional heart care, we have created designated Provider Care Teams.  These Care Teams include your primary Cardiologist (physician) and Advanced Practice Providers (APPs -  Physician Assistants and Nurse Practitioners) who all work together to provide you with the care you need, when you need it.  We recommend signing up for the patient portal called "MyChart".  Sign up information is provided on this After Visit Summary.  MyChart is used to connect with patients for Virtual Visits (Telemedicine).  Patients are able to view lab/test results, encounter notes, upcoming appointments, etc.  Non-urgent messages can be sent to your provider as well.   To learn more about what you can do with MyChart, go to [email or web address].    Your next appointment:   12 month follow up

## 2024-02-06 ENCOUNTER — Other Ambulatory Visit: Payer: Self-pay | Admitting: Cardiology

## 2024-02-06 DIAGNOSIS — R002 Palpitations: Secondary | ICD-10-CM

## 2024-07-01 ENCOUNTER — Telehealth: Payer: Self-pay | Admitting: Gastroenterology

## 2024-07-01 NOTE — Telephone Encounter (Signed)
 Inbound call from patient stating that she has been dealing with rectal bleeding for about a month now and it seem to be getting worse. Patient stated that when she goes and making a bowel movement there is blood as if she was on her period and in the toilet as well. Patient also has a lot of sensation of needing to constantly have a bowel movement. Patient is requesting a call back to her work number at [phone]. Please advise.

## 2024-07-01 NOTE — Telephone Encounter (Signed)
 Pt stated that she has been having rectal bleeding for over a month now. BRB mixed in with BM and some separate. Also complains of not being able to empty when having a BM and some lower abdominal pain. Pt states that she averages 1-2 BM per day.  Pt was scheduled to see Deanna May NP on 07/05/2024 at 1:30 PM. Pt made aware. Address provided.  Pt was notified that if she has an increase in bleeding, chest pain, shortness of breath, light headedness or dizziness then she will need to proceed to the urgent care or ED.  Pt verbalized understanding with all questions answered.

## 2024-07-05 ENCOUNTER — Ambulatory Visit: Admitting: Gastroenterology
